# Patient Record
Sex: Female | Born: 1995 | Race: Black or African American | Hispanic: No | Marital: Single | Smoking: Never smoker
Health system: Southern US, Community
[De-identification: ages and names within clinical notes are randomized; demographics above are authoritative.]

## PROBLEM LIST (undated history)

## (undated) DIAGNOSIS — K219 Gastro-esophageal reflux disease without esophagitis: Secondary | ICD-10-CM

## (undated) DIAGNOSIS — R51 Headache: Secondary | ICD-10-CM

## (undated) DIAGNOSIS — F329 Major depressive disorder, single episode, unspecified: Secondary | ICD-10-CM

## (undated) DIAGNOSIS — K589 Irritable bowel syndrome without diarrhea: Secondary | ICD-10-CM

## (undated) DIAGNOSIS — H9319 Tinnitus, unspecified ear: Secondary | ICD-10-CM

## (undated) DIAGNOSIS — F32A Depression, unspecified: Secondary | ICD-10-CM

## (undated) HISTORY — DX: Tinnitus, unspecified ear: H93.19

## (undated) HISTORY — DX: Gastro-esophageal reflux disease without esophagitis: K21.9

## (undated) HISTORY — DX: Headache: R51

## (undated) HISTORY — DX: Irritable bowel syndrome, unspecified: K58.9

---
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
## 2017-01-01 DIAGNOSIS — F4310 Post-traumatic stress disorder, unspecified: Secondary | ICD-10-CM | POA: Diagnosis not present

## 2017-01-01 DIAGNOSIS — F321 Major depressive disorder, single episode, moderate: Secondary | ICD-10-CM | POA: Diagnosis not present

## 2017-01-01 DIAGNOSIS — F411 Generalized anxiety disorder: Secondary | ICD-10-CM | POA: Diagnosis not present

## 2017-01-03 ENCOUNTER — Encounter: Payer: Self-pay | Admitting: Pediatrics

## 2017-01-03 ENCOUNTER — Ambulatory Visit (INDEPENDENT_AMBULATORY_CARE_PROVIDER_SITE_OTHER): Payer: BLUE CROSS/BLUE SHIELD | Admitting: Pediatrics

## 2017-01-03 VITALS — BP 104/71 | HR 90 | Ht 60.24 in | Wt 118.6 lb

## 2017-01-03 DIAGNOSIS — E559 Vitamin D deficiency, unspecified: Secondary | ICD-10-CM

## 2017-01-03 DIAGNOSIS — Z975 Presence of (intrauterine) contraceptive device: Secondary | ICD-10-CM | POA: Diagnosis not present

## 2017-01-03 DIAGNOSIS — L918 Other hypertrophic disorders of the skin: Secondary | ICD-10-CM

## 2017-01-03 DIAGNOSIS — N898 Other specified noninflammatory disorders of vagina: Secondary | ICD-10-CM

## 2017-01-03 NOTE — Patient Instructions (Signed)
Go see GYN for your skin tag. They can likely remove it  We will send your wet prep and vitamin D labs to you when they come back on mychart

## 2017-01-03 NOTE — Progress Notes (Signed)
THIS RECORD MAY CONTAIN CONFIDENTIAL INFORMATION THAT SHOULD NOT BE RELEASED WITHOUT REVIEW OF THE SERVICE PROVIDER.  Adolescent Medicine Consultation Follow-Up Visit Bianca Forbes  is a 21 y.o. female referred by Kalman Jewels, MD here today for follow-up regarding vaginal discharge.    [facility] 09[facility]/16 for vaginal discharge, no infectious etiology found.   Plan at last visit included labs and monitor bleeding   - Pertinent Labs? Yes  Results for orders placed or performed in visit on 09/18/16  WET PREP BY MOLECULAR PROBE  Result Value Ref Range   Candida species NOT DETECTED NOT DETECTED   Trichomonas vaginosis NOT DETECTED NOT DETECTED   Gardnerella vaginalis NOT DETECTED NOT DETECTED  GC/Chlamydia Probe Amp  Result Value Ref Range   CT Probe RNA NOT DETECTED    GC Probe RNA NOT DETECTED     - Growth Chart Viewed? yes   History was provided by the patient.  PCP Confirmed?  yes  My Chart Activated?   yes   Chief Complaint  Patient presents with  . Follow-up    HPI:    Has some breakthrough bleeding. Last noticed bleeding  2 weeks ago and lasted for 1 week. She doesn't remember having any prior episodes of breakthrough bleeding this year.   She reports that she has her "normal" periods that occur every month. Usually last for 1 week and are associated with menstrual cramps. Bleeds heavy first 2 days (2 pads), last few days (1 pad).   She reports a small amount of white discharge. No vaginal itching. No burning during urination. Not sexual active.  She would like to have her Vit D checked because she reports that it has been low in the past. She feels like she has been more stressed she has been really sad. She was taking Cymbalta daily, but stopped 2 months ago due to dry mouth. She is now taking Wellbutrin, initially started at 100 mg, now taking 300 mg daily.    Review of Systems  Constitutional: Negative for malaise/fatigue.   Eyes: Negative for double vision.  Respiratory: Negative for shortness of breath.   Cardiovascular: Negative for chest pain and palpitations.  Gastrointestinal: Negative for abdominal pain, constipation, diarrhea, nausea and vomiting.  Genitourinary: Negative for dysuria.  Musculoskeletal: Negative for joint pain and myalgias.  Skin: Negative for rash.  Neurological: Negative for dizziness and headaches.  Endo/Heme/Allergies: Does not bruise/bleed easily.  Psychiatric/Behavioral: Positive for depression. The patient is nervous/anxious.      No LMP recorded. Patient has had an implant. No Known Allergies Outpatient Medications Prior to Visit  Medication Sig Dispense Refill  . etonogestrel (NEXPLANON) 68 MG IMPL implant 1 each by Subdermal route once.    . DULoxetine (CYMBALTA) 30 MG capsule Take 90 mg by mouth daily.     No facility-administered medications prior to visit.      Patient Active Problem List   Diagnosis Date Noted  . Benign fasciculations 08/24/2016  . Thoracic back pain 10/12/2015  . Migraine without aura and without status migrainosus, not intractable 09/21/2015  . Episodic tension-type headache, not intractable 09/21/2015  . Orbital pain 09/21/2015  . Central auditory processing disorder 05/24/2015  . Prediabetes 05/24/2015  . Menorrhagia with regular cycle 08/06/2014  . 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 Eczema 05/21/2014  . Nexplanon in place 04/08/2014  . Leukorrhea, vaginal, noninfectious 02/06/2013  . Vitamin D insufficiency 02/01/2013  . Irritable bowel syndrome 01/06/2013  . 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  Anxiety state 03/20/2012      The following portions of the patient's history were reviewed and updated as appropriate: allergies, current medications, past family history, past medical history, past social history, past surgical history and problem list.  Physical Exam:  Vitals:   01/03/17 1411  BP: 104/71  Pulse: 90  Weight: 118 lb 9.6 oz  (53.8 kg)  Height: 5' 0.24" (1.53 m)   BP 104/71   Pulse 90   Ht 5' 0.24" (1.53 m)   Wt 118 lb 9.6 oz (53.8 kg)   BMI 22.98 kg/m  Body mass index: body mass index is 22.98 kg/m. Blood pressure percentiles are 26 % systolic and 76 % diastolic based on the August 2017 AAP Clinical Practice Guideline. Blood pressure percentile targets: 90: 123/76, 95: 127/79, 95 + 12 mmHg: 139/91.   Physical Exam  Constitutional: She appears well-developed. No distress.  HENT:  Mouth/Throat: Oropharynx is clear and moist.  Neck: No thyromegaly present.  Cardiovascular: Normal rate and regular rhythm.   No murmur heard. Pulmonary/Chest: Breath sounds normal.  Abdominal: Soft. She exhibits no mass. There is no tenderness. There is no guarding.  Genitourinary: There is no rash on the right labia. There is no rash on the left labia. No bleeding in the vagina. No vaginal discharge found.  Genitourinary Comments: Skin tag on vaginal mucosa. No bleeding or tears noted.   Musculoskeletal: She exhibits no edema.  Lymphadenopathy:    She has no cervical adenopathy.  Neurological: She is alert.  Skin: Skin is warm. No rash noted.  Psychiatric: She has a normal mood and affect.  Nursing note and vitals reviewed.   Assessment/Plan:  1. Nexplanon in place - Discussed that breakthrough bleeding is a common side effect of the nexplanon implant. Patient understood and is okay with continuing with the implant.   2. Vaginal discharge - External vaginal exam with a minimal amount of clear/white discharge with no significant odor. Most likely due to her ongoing leukorrhea. However, will screen for infection. - WET PREP BY MOLECULAR PROBE - GC/Chlamydia Probe Amp - HIV antibody (with reflex)  3. Vitamin D insufficiency - Last vit D level was 17 on 06/06/16. Will repeat lab today.  - Encouraged patient to start Vit D supplementation (2,000 IU daily). - VITAMIN D 25 Hydroxy (Vit-D Deficiency, Fractures)  4. Skin  tag of vaginal mucosa - Ambulatory referral to Gynecology   Follow-up:  PRN  Medical decision-making:  >25 minutes spent face to face with patient with more than 50% of appointment spent discussing diagnosis, management, follow-up, and reviewing of vaginal discharge, pelvic exam, anxiety.

## 2017-01-04 ENCOUNTER — Other Ambulatory Visit: Payer: Self-pay | Admitting: Pediatrics

## 2017-01-04 LAB — WET PREP BY MOLECULAR PROBE
Candida species: DETECTED — AB
Gardnerella vaginalis: NOT DETECTED
MICRO NUMBER:: 81104357
SPECIMEN QUALITY:: ADEQUATE
Trichomonas vaginosis: NOT DETECTED

## 2017-01-04 LAB — VITAMIN D 25 HYDROXY (VIT D DEFICIENCY, FRACTURES): Vit D, 25-Hydroxy: 15 ng/mL — ABNORMAL LOW (ref 30–100)

## 2017-01-04 MED ORDER — FLUCONAZOLE 150 MG PO TABS: 150.0000 mg | ORAL_TABLET | Freq: Every day | ORAL | 0 refills | Status: DC

## 2017-01-04 MED ORDER — VITAMIN D (ERGOCALCIFEROL) 1.25 MG (50000 UNIT) PO CAPS: 50000.0000 [IU] | ORAL_CAPSULE | ORAL | 0 refills | Status: DC

## 2017-01-07 DIAGNOSIS — F4310 Post-traumatic stress disorder, unspecified: Secondary | ICD-10-CM | POA: Diagnosis not present

## 2017-01-08 DIAGNOSIS — F4310 Post-traumatic stress disorder, unspecified: Secondary | ICD-10-CM | POA: Diagnosis not present

## 2017-01-14 ENCOUNTER — Encounter: Payer: Self-pay | Admitting: Obstetrics & Gynecology

## 2017-01-14 DIAGNOSIS — F4310 Post-traumatic stress disorder, unspecified: Secondary | ICD-10-CM | POA: Diagnosis not present

## 2017-01-15 DIAGNOSIS — F4310 Post-traumatic stress disorder, unspecified: Secondary | ICD-10-CM | POA: Diagnosis not present

## 2017-01-29 DIAGNOSIS — F321 Major depressive disorder, single episode, moderate: Secondary | ICD-10-CM | POA: Diagnosis not present

## 2017-01-29 DIAGNOSIS — F4310 Post-traumatic stress disorder, unspecified: Secondary | ICD-10-CM | POA: Diagnosis not present

## 2017-01-29 DIAGNOSIS — Z01419 Encounter for gynecological examination (general) (routine) without abnormal findings: Secondary | ICD-10-CM | POA: Diagnosis not present

## 2017-01-29 DIAGNOSIS — F411 Generalized anxiety disorder: Secondary | ICD-10-CM | POA: Diagnosis not present

## 2017-01-29 DIAGNOSIS — N898 Other specified noninflammatory disorders of vagina: Secondary | ICD-10-CM | POA: Diagnosis not present

## 2017-02-11 ENCOUNTER — Encounter: Payer: BLUE CROSS/BLUE SHIELD | Admitting: Obstetrics & Gynecology

## 2017-02-25 DIAGNOSIS — F4310 Post-traumatic stress disorder, unspecified: Secondary | ICD-10-CM | POA: Diagnosis not present

## 2017-02-26 DIAGNOSIS — F4310 Post-traumatic stress disorder, unspecified: Secondary | ICD-10-CM | POA: Diagnosis not present

## 2017-03-04 DIAGNOSIS — F4310 Post-traumatic stress disorder, unspecified: Secondary | ICD-10-CM | POA: Diagnosis not present

## 2017-03-05 DIAGNOSIS — F4310 Post-traumatic stress disorder, unspecified: Secondary | ICD-10-CM | POA: Diagnosis not present

## 2017-03-07 DIAGNOSIS — B373 Candidiasis of vulva and vagina: Secondary | ICD-10-CM | POA: Diagnosis not present

## 2017-03-11 DIAGNOSIS — F4310 Post-traumatic stress disorder, unspecified: Secondary | ICD-10-CM | POA: Diagnosis not present

## 2017-03-12 DIAGNOSIS — F4310 Post-traumatic stress disorder, unspecified: Secondary | ICD-10-CM | POA: Diagnosis not present

## 2017-03-13 DIAGNOSIS — M5414 Radiculopathy, thoracic region: Secondary | ICD-10-CM | POA: Diagnosis not present

## 2017-03-13 DIAGNOSIS — M9902 Segmental and somatic dysfunction of thoracic region: Secondary | ICD-10-CM | POA: Diagnosis not present

## 2017-03-13 DIAGNOSIS — M9903 Segmental and somatic dysfunction of lumbar region: Secondary | ICD-10-CM | POA: Diagnosis not present

## 2017-03-13 DIAGNOSIS — M5386 Other specified dorsopathies, lumbar region: Secondary | ICD-10-CM | POA: Diagnosis not present

## 2017-03-21 DIAGNOSIS — F4310 Post-traumatic stress disorder, unspecified: Secondary | ICD-10-CM | POA: Diagnosis not present

## 2017-03-21 DIAGNOSIS — F321 Major depressive disorder, single episode, moderate: Secondary | ICD-10-CM | POA: Diagnosis not present

## 2017-03-21 DIAGNOSIS — F411 Generalized anxiety disorder: Secondary | ICD-10-CM | POA: Diagnosis not present

## 2017-03-31 DIAGNOSIS — M5386 Other specified dorsopathies, lumbar region: Secondary | ICD-10-CM | POA: Diagnosis not present

## 2017-03-31 DIAGNOSIS — M5414 Radiculopathy, thoracic region: Secondary | ICD-10-CM | POA: Diagnosis not present

## 2017-03-31 DIAGNOSIS — M9903 Segmental and somatic dysfunction of lumbar region: Secondary | ICD-10-CM | POA: Diagnosis not present

## 2017-03-31 DIAGNOSIS — M9902 Segmental and somatic dysfunction of thoracic region: Secondary | ICD-10-CM | POA: Diagnosis not present

## 2017-04-09 ENCOUNTER — Other Ambulatory Visit: Payer: Self-pay | Admitting: Pediatrics

## 2017-04-26 DIAGNOSIS — F411 Generalized anxiety disorder: Secondary | ICD-10-CM | POA: Diagnosis not present

## 2017-04-26 DIAGNOSIS — F321 Major depressive disorder, single episode, moderate: Secondary | ICD-10-CM | POA: Diagnosis not present

## 2017-04-26 DIAGNOSIS — F4310 Post-traumatic stress disorder, unspecified: Secondary | ICD-10-CM | POA: Diagnosis not present

## 2017-04-29 DIAGNOSIS — F4310 Post-traumatic stress disorder, unspecified: Secondary | ICD-10-CM | POA: Diagnosis not present

## 2017-04-30 DIAGNOSIS — B379 Candidiasis, unspecified: Secondary | ICD-10-CM | POA: Diagnosis not present

## 2017-04-30 DIAGNOSIS — R21 Rash and other nonspecific skin eruption: Secondary | ICD-10-CM | POA: Diagnosis not present

## 2017-05-12 DIAGNOSIS — F4310 Post-traumatic stress disorder, unspecified: Secondary | ICD-10-CM | POA: Diagnosis not present

## 2017-05-14 DIAGNOSIS — B379 Candidiasis, unspecified: Secondary | ICD-10-CM | POA: Diagnosis not present

## 2017-05-14 DIAGNOSIS — B373 Candidiasis of vulva and vagina: Secondary | ICD-10-CM | POA: Diagnosis not present

## 2017-07-05 DIAGNOSIS — F411 Generalized anxiety disorder: Secondary | ICD-10-CM | POA: Diagnosis not present

## 2017-07-05 DIAGNOSIS — F321 Major depressive disorder, single episode, moderate: Secondary | ICD-10-CM | POA: Diagnosis not present

## 2017-07-05 DIAGNOSIS — F4310 Post-traumatic stress disorder, unspecified: Secondary | ICD-10-CM | POA: Diagnosis not present

## 2017-09-02 DIAGNOSIS — F411 Generalized anxiety disorder: Secondary | ICD-10-CM | POA: Diagnosis not present

## 2017-09-02 DIAGNOSIS — F321 Major depressive disorder, single episode, moderate: Secondary | ICD-10-CM | POA: Diagnosis not present

## 2017-09-02 DIAGNOSIS — F4310 Post-traumatic stress disorder, unspecified: Secondary | ICD-10-CM | POA: Diagnosis not present

## 2017-10-31 DIAGNOSIS — F321 Major depressive disorder, single episode, moderate: Secondary | ICD-10-CM | POA: Diagnosis not present

## 2017-10-31 DIAGNOSIS — F411 Generalized anxiety disorder: Secondary | ICD-10-CM | POA: Diagnosis not present

## 2017-10-31 DIAGNOSIS — F4310 Post-traumatic stress disorder, unspecified: Secondary | ICD-10-CM | POA: Diagnosis not present

## 2018-05-12 DIAGNOSIS — B373 Candidiasis of vulva and vagina: Secondary | ICD-10-CM | POA: Diagnosis not present

## 2018-05-13 DIAGNOSIS — E559 Vitamin D deficiency, unspecified: Secondary | ICD-10-CM | POA: Diagnosis not present

## 2018-06-06 DIAGNOSIS — Z3009 Encounter for other general counseling and advice on contraception: Secondary | ICD-10-CM | POA: Diagnosis not present

## 2018-06-06 DIAGNOSIS — N760 Acute vaginitis: Secondary | ICD-10-CM | POA: Diagnosis not present

## 2018-06-06 DIAGNOSIS — Z3046 Encounter for surveillance of implantable subdermal contraceptive: Secondary | ICD-10-CM | POA: Diagnosis not present

## 2018-08-04 DIAGNOSIS — F332 Major depressive disorder, recurrent severe without psychotic features: Secondary | ICD-10-CM | POA: Diagnosis not present

## 2018-08-04 DIAGNOSIS — F411 Generalized anxiety disorder: Secondary | ICD-10-CM | POA: Diagnosis not present

## 2018-08-04 DIAGNOSIS — F4310 Post-traumatic stress disorder, unspecified: Secondary | ICD-10-CM | POA: Diagnosis not present

## 2018-09-24 DIAGNOSIS — F4310 Post-traumatic stress disorder, unspecified: Secondary | ICD-10-CM | POA: Diagnosis not present

## 2018-09-24 DIAGNOSIS — F332 Major depressive disorder, recurrent severe without psychotic features: Secondary | ICD-10-CM | POA: Diagnosis not present

## 2018-09-24 DIAGNOSIS — F411 Generalized anxiety disorder: Secondary | ICD-10-CM | POA: Diagnosis not present

## 2018-10-29 ENCOUNTER — Telehealth (INDEPENDENT_AMBULATORY_CARE_PROVIDER_SITE_OTHER): Payer: Self-pay | Admitting: Pediatrics

## 2018-10-29 NOTE — Telephone Encounter (Signed)
I called and spoke with Khira. She said that she has had muscle spasms in various places all over her body for years but that recently she has been having "intense" spasms in her temples that prevent her from sleeping at night. She has tried ice packs with some relief. She worries that these spasms are indicative of some ominous neurological problem and wants advice on how to get relief from the spasms when they occur. In talking with Karista, she also reported that she is seeing a psychiatrist for anxiety, depression, PTSD and OCD. She said that she takes a daily medication for these problems and that the psychiatrist just gave her a benzodiazepine for the muscle spasms in her temples but she has not taken it yet. I talked with Takeria and told her that the muscle tightness that she reports is common with anxiety and with tension headaches. I told her that there was no specific treatment but that lifestyle measures to manage anxiety and mood worked best. I recommended adequate hydration, relaxation exercises and warm or cold packs to the painful area. I also told her that there were numerous apps that helped with relaxation. She asked if the benzodiazepine prescribed by her psychiatrist would be recommended and/or beneficial and I told her that while these medications can be helpful temporarily that they were not a specific treatment or solution, that they could be very habit forming and should only be used very sparingly. Merridith asked if Botox would be a recommended treatment and I told her not with the information that I have at this time. Finally, she said that she had made a follow up appointment in this office with me on August 10th. I will consult with Dr Gaynell Face as needed when she is in the office that day. TG

## 2018-10-29 NOTE — Telephone Encounter (Signed)
°  Who's calling (name and relationship to patient) : Jessamine (Self) Best contact number: [phone] Provider they see: Dr. Gaynell Face  Reason for call: Pt stated she is having intense muscle spasms and would like to speak with the on call provider.

## 2018-10-30 NOTE — Telephone Encounter (Signed)
I reviewed the note and agree with the assessment and recommendations.  This may be some form of migraine or tension type headache.  There is no evidence based on her concerns that there is some underlying progressive focal or generalized neurologic disorder.

## 2018-11-10 ENCOUNTER — Other Ambulatory Visit: Payer: Self-pay

## 2018-11-10 ENCOUNTER — Encounter (INDEPENDENT_AMBULATORY_CARE_PROVIDER_SITE_OTHER): Payer: Self-pay | Admitting: Family

## 2018-11-10 ENCOUNTER — Ambulatory Visit (INDEPENDENT_AMBULATORY_CARE_PROVIDER_SITE_OTHER): Payer: Medicaid Other | Admitting: Family

## 2018-11-10 VITALS — BP 106/74 | HR 100 | Ht 60.0 in | Wt 110.4 lb

## 2018-11-10 DIAGNOSIS — R253 Fasciculation: Secondary | ICD-10-CM

## 2018-11-10 MED ORDER — CYCLOBENZAPRINE HCL 5 MG PO TABS: ORAL_TABLET | ORAL | 0 refills | Status: DC

## 2018-11-10 NOTE — Progress Notes (Signed)
Bianca Forbes   MRN:  [id number]  1995/12/18   Provider: Rockwell Germany NP-C Location of Care: [facility]  Visit type: Routine Follow-Up  Last visit: 08/24/2016 with Dr. Gaynell Face  Referral source: Lenore Cordia, MD History from: [facility] chart and patient  Brief history:  History of atypical migraines, migraine without aura, status migrainosus, tension headaches, tinnitus and subjective muscle fasciculations, as well as depression, anxiety, panic, PTSD and OCD. She is seeing a psychiatrist for the behavioral health disorders.   Today's concerns: Bianca Forbes is being seen today because she called the office on October 29, 2018 to report "muscle spasms" in the extremities as well as in her temples that can keep her from sleep or can awaken her from sleep. She was very fearful that these muscle spasms represent an ominous neurologic problems and remains very fearful of this. She said that her psychiatrist gave her a Benzodiazepine for it but she doesn't know the name of the drug. She said that the strength is 0.86m and it did not help with her complaints of spasms. She notified the psychiatrist who reportedly increased the dose. [name] tells me that the medication was selected for her based on a genetic test to determine the best treatment for her. The medication list indicates that this drug would be Clonazepam but [name] is unsure of that fact. She also reports taking Prozac and Wellbutrin. She reports trying relaxation exercises but said that doing the exercises made her feel more anxious.   For the muscle spasms she says that they can be visible externally, but typically she can feel them occurring rather than being visible. She also complains of sudden jerks in her body that startle her when they occur. She has not lost consciousness with any of these events. Bianca Forbes denies severe headaches but says that she has occasional headaches that require rest to resolve. In addition to  muscle spasms and jerks, she also reports tremors in her hands that varies in severity. She says that the tremor worsens when she is emotionally upset or anxious but that it does not interfere with her daily functioning.    Bianca Forbes reports that she had a closed head injury about 5 years ago in which she fainted and hit her head on a granite countertop. She said that she was not evaluated for this injury and she worries that the muscle spasms are occurring as a latent effect of the head injury. Bianca Forbes also tells me that she had childhood trauma and knows that she has a score related to adverse outcomes of childhood trauma. She said that she has been told that this scoring relates to brain function and also worries that this is the cause of the muscle spasms. She reports having a normal brain MRI several years ago but is fearful that "something is interfering with brain function" since then. She wants "full and complete work up" to determine the cause of the involuntary movements because she has a BDietitianin psychology and she knows that spasms and other involuntary movements indicate a neurologic disorder. Bianca Forbes also expressed frustration that symptoms or complaints from black women are taken less seriously than those of white counterparts, and says that she is advocating for adequate evaluation and treatment for herself.   Bianca Forbes reports being otherwise generally healthy since her last visit with Dr HGaynell Facein 2018. She [facility] [employer] to take this year off, then return to graduate studies next year to pursue a PhD in  psychology. She has no other health concerns today other than previously mentioned.   Review of systems: Please see HPI for neurologic and other pertinent review of systems. Otherwise all other systems were reviewed and were negative.  Problem List: Patient Active Problem List   Diagnosis Date Noted   Benign fasciculations 08/24/2016   Thoracic back pain  10/12/2015   Migraine without aura and without status migrainosus, not intractable 09/21/2015   Episodic tension-type headache, not intractable 09/21/2015   Orbital pain 09/21/2015   Central auditory processing disorder 05/24/2015   Prediabetes 05/24/2015   Menorrhagia with regular cycle 08/06/2014   Tinnitus, subjective 06/28/2014   Eczema 05/21/2014   Nexplanon in place 04/08/2014   Leukorrhea, vaginal, noninfectious 02/06/2013   Vitamin D insufficiency 02/01/2013   Irritable bowel syndrome 01/06/2013   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   Family history: family history includes Alzheimer's disease in her maternal grandmother; Diabetes in her mother; Irritable bowel syndrome in her father; Kidney disease in her maternal grandfather; Lactose intolerance in her father; Other in her paternal grandfather; Seizures in her cousin and maternal grandmother.   Social history: Social History   Socioeconomic History   Marital status: Single    Spouse name: Not on file   Number of children: Not on file   Years of education: Not on file   Highest education level: Not on file  Occupational History   Not on file  Social Needs   Financial resource strain: Not on file   Food insecurity    Worry: Not on file    Inability: Not on file   Transportation needs    Medical: Not on file    Non-medical: Not on file  Tobacco Use   Smoking status: Never Smoker   Smokeless tobacco: Never Used  Substance and Sexual Activity   Alcohol use: No    Alcohol/week: 0.0 standard drinks   Drug use: No   Sexual  activity: Never  Lifestyle   Physical activity    Days per week: Not on file    Minutes per session: Not on file   Stress: Not on file  Relationships   Social connections    Talks on phone: Not on file    Gets together: Not on file    Attends religious service: Not on file    Active member of club or organization: Not on file    Attends meetings of clubs or organizations: Not on file    Relationship status: Not on file   Intimate partner violence    Fear of current or ex partner: Not on file    Emotionally abused: Not on file    Physically abused: Not on file    Forced sexual activity: Not on file  Other Topics Concern   Not on file  Social History Narrative   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    She lives with both parents.      Past/failed  meds:   Allergies: No Known Allergies    Immunizations: Immunization History  Administered Date(s) Administered   DTaP 11/15/1997, 01/17/1998, 03/30/1998, 04/07/1999, 09/25/2002   HPV 9-valent 05/21/2014   HPV Quadrivalent 09/04/2012, 01/06/2013   Hepatitis A 09/04/2012   Hepatitis A, Ped/Adol-2 Dose 05/21/2014   Hepatitis B December [birth year], 1997, 03/30/1998, 07/27/1998   HiB (PRP-OMP) 11/15/1997, 01/17/1998, 03/30/1998, 04/17/1999   IPV 11/15/1997, 01/17/1998, 04/07/1999, 09/25/2002   Influenza,inj,Quad PF,6+ Mos 05/24/2015   Influenza-Unspecified 01/06/2013   MMR 04/07/1999, 09/25/2002   Meningococcal Conjugate 09/04/2012, 05/21/2014   Td 08/06/2008   Tdap 08/06/2008   Varicella 04/07/1999, 08/06/2008      Diagnostics/Screenings: 01/01/2012 - MRI Brain w/wo contrast [facility]) - normal examination   Physical Exam: BP 106/74    Pulse 100    Ht 5' (1.524 m)    Wt 110 lb 6.4 oz (50.1 kg)    BMI 21.56 kg/m   General: Well developed, well nourished young woman, seated on exam table, in no  evident distress, black hair, brown eyes, right handed Head: Head normocephalic and atraumatic.  Oropharynx benign. Neck: Supple with no carotid bruits Cardiovascular: Regular rate and rhythm, no murmurs Respiratory: Breath sounds clear to auscultation Musculoskeletal: No obvious deformities or scoliosis Skin: No rashes or neurocutaneous lesions  Neurologic Exam Mental Status: Awake and fully alert.  Oriented to place and time.  Recent and remote memory intact.  Attention span, concentration, and fund of knowledge appropriate.  Mood and affect anxious, and she seemed to be near tears at times when relating her concerns. Speech was rapid and she repeated her thoughts frequently.  Cranial Nerves: Fundoscopic exam reveals sharp disc margins.  Pupils equal, briskly reactive to light.  Extraocular movements full without nystagmus.  Visual fields full to confrontation.  Hearing intact and symmetric to finger rub.  Facial sensation intact.  Face tongue, palate move normally and symmetrically.  Neck flexion and extension normal. Motor: Normal bulk and tone. Normal strength in all tested extremity muscles. Very mild outstretched hand tremor. Sensory: Intact to touch and temperature in all extremities.  Coordination: Rapid alternating movements normal in all extremities.  Finger-to-nose and heel-to shin performed accurately bilaterally.  Romberg negative. Gait and Station: Arises from chair without difficulty.  Stance is normal. Gait demonstrates normal stride length and balance.   Able to heel, toe and tandem walk without difficulty. Reflexes: 1+ and symmetric. Toes downgoing.  Impression: 1. Reports of muscle spasms 2. Reports of sudden muscle jerks 3. Essential tremor 4. Migraine without aura 5. Tension headaches 6. Mood disorder with depression, anxiety, panic and reports of PTSD and OCD.    Recommendations for plan of care: The patient's previous [facility] records were reviewed. Tresa has neither  had nor required imaging or lab studies since the last visit. She is a 23 year old young woman with complains of muscle spasms, sudden body jerks, essential tremor, migraine and tension headaches as well as mood disorder treated by a psychiatrist. I talked with Mariama about her concerns and attempted to reassure her that she has a normal examination and that indicates that her brain is healthy. I recommended performing an EEG because of her complaints of sudden jerks, but told her that this likely represents myoclonus. I told her that the EEG would also indicate if there was impedence that is sometimes seen with mass or lesion. We talked about involuntary movements such as essential tremor and muscle spasms occurring in healthy individuals, and that the spasm that she has noted in  her temples can be from headache. We also talked about the role of mood and anxiety in her symptoms but she remained adamant that anxiety was a separate problem and that her symptoms were indicative of a neurologic disorder. I asked her to get the EEG done, and to return for follow up in 1-2 weeks so that we can review the results together. I also recommended a trial of low dose Cyclobenzaprine for muscle spasms that prevent her from sleeping at night. I strongly cautioned her not to take it during the day as it as a side effect of sleepiness. I also talked with her about trying different relaxation exercises as there are some that may work better than others for her. Annelyse agreed with the plans made today.   The medication list was reviewed and reconciled. I reviewed changes that were made in the prescribed medications today. A complete medication list was provided to the patient.  Allergies as of 11/10/2018   No Known Allergies     Medication List       Accurate as of November 10, 2018 11:59 PM. If you have any questions, ask your nurse or doctor.        buPROPion 300 MG 24 hr tablet Commonly known as: WELLBUTRIN XL Take  300 mg by mouth daily.   clonazePAM 0.5 MG tablet Commonly known as: KLONOPIN TAKE 1 2 TO 1 (ONE HALF TO ONE) TABLET BY MOUTH TWICE DAILY AS NEEDED FOR ANXIETY AND PANIC ATTACK   cyclobenzaprine 5 MG tablet Commonly known as: FLEXERIL Take 1/2 to 1 tablet at bedtime when muscle spasms keep you awake Started by: Rockwell Germany, NP   desvenlafaxine 50 MG 24 hr tablet Commonly known as: PRISTIQ TAKE 1 TABLET BY MOUTH ONCE DAILY FOR ANXIETY   fluconazole 150 MG tablet Commonly known as: Diflucan Take 1 tablet (150 mg total) by mouth daily.   mirtazapine 7.5 MG tablet Commonly known as: REMERON Take 7.5 mg by mouth at bedtime.   Nexplanon 68 MG Impl implant Generic drug: etonogestrel 1 each by Subdermal route once.   Vitamin D (Ergocalciferol) 1.25 MG (50000 UT) Caps capsule Commonly known as: DRISDOL TAKE 1 CAPSULE (50,000 UNITS TOTAL) BY MOUTH EVERY 7 (SEVEN) DAYS.       I consulted with Dr Gaynell Face regarding this patient.  Total time spent with the patient was 35 minutes, of which 50% or more was spent in counseling and coordination of care.  Rockwell Germany NP-C [facility] Ph. [phone] Fax [phone]

## 2018-11-10 NOTE — Patient Instructions (Addendum)
Thank you for coming in today.   Instructions for you until your next appointment are as follows: 1. We will perform an EEG in this office to evaluate brain wave activity. I will meet with you in a couple of weeks to review the results.  2. For muscle spasms at night that keep you from sleeping, try Cyclobenzaprine - 1/2 to 1 tablet at bedtime 3. Work on relaxation exercises as we discussed today.  4. Please sign up for MyChart if you have not done so 5. Please plan to return for follow up in about 2 weeks or sooner if needed.

## 2018-11-11 ENCOUNTER — Encounter (INDEPENDENT_AMBULATORY_CARE_PROVIDER_SITE_OTHER): Payer: Self-pay | Admitting: Family

## 2018-11-25 ENCOUNTER — Ambulatory Visit (INDEPENDENT_AMBULATORY_CARE_PROVIDER_SITE_OTHER): Payer: Medicaid Other

## 2018-11-25 ENCOUNTER — Other Ambulatory Visit: Payer: Self-pay

## 2018-11-25 ENCOUNTER — Ambulatory Visit (INDEPENDENT_AMBULATORY_CARE_PROVIDER_SITE_OTHER): Payer: Medicaid Other | Admitting: Pediatrics

## 2018-11-25 DIAGNOSIS — R253 Fasciculation: Secondary | ICD-10-CM

## 2018-11-25 DIAGNOSIS — G2569 Other tics of organic origin: Secondary | ICD-10-CM | POA: Diagnosis not present

## 2018-11-25 NOTE — Progress Notes (Signed)
EEG complete - results pending 

## 2018-11-26 ENCOUNTER — Ambulatory Visit (INDEPENDENT_AMBULATORY_CARE_PROVIDER_SITE_OTHER): Payer: Medicaid Other | Admitting: Pediatrics

## 2018-11-26 ENCOUNTER — Encounter (INDEPENDENT_AMBULATORY_CARE_PROVIDER_SITE_OTHER): Payer: Self-pay | Admitting: Pediatrics

## 2018-11-26 ENCOUNTER — Other Ambulatory Visit: Payer: Self-pay

## 2018-11-26 VITALS — BP 110/80 | HR 84 | Ht 60.75 in | Wt 113.6 lb

## 2018-11-26 DIAGNOSIS — R253 Fasciculation: Secondary | ICD-10-CM | POA: Diagnosis not present

## 2018-11-26 DIAGNOSIS — G4489 Other headache syndrome: Secondary | ICD-10-CM | POA: Diagnosis not present

## 2018-11-26 DIAGNOSIS — G250 Essential tremor: Secondary | ICD-10-CM | POA: Diagnosis not present

## 2018-11-26 DIAGNOSIS — M62838 Other muscle spasm: Secondary | ICD-10-CM | POA: Diagnosis not present

## 2018-11-26 NOTE — Progress Notes (Signed)
Patient: Bianca Forbes MRN: [id number] Sex: female DOB: 05-16-1995  Provider: Ellison Carwin, MD Location of Care: [facility]  Note type: Routine return visit  History of Present Illness: Referral Source: Delorse Lek, MD History from: patient and [facility] chart Chief Complaint: muscle spasms/shaking hands  Bianca Forbes is a 23 y.o. female who was evaluated on January 26, 2019, for the first time since January 10, 2019.  She has a history of atypical migraines, migraine without aura, status migrainosus, episodic tension-type headaches, tinnitus, and subjective muscle fasciculations.  She also has significant problems with depression, anxiety, panic disorder, PTSD, and OCD.  She is followed for this by a psychiatrist.  She was seen and a detailed evaluation performed on November 10, 2018, by Elveria Rising.  She says that she has muscle spasms that happen when she is awake or asleep, but that when she experiences them, she cannot fall asleep.  She says that she feels as if the muscles are tight and the only way that she can get to sleep is if she takes a tennis ball and pushes it against her temple.  Sometimes, that helps counteract the spasms and allows her to fall asleep.  She says that she sleeps about 6 to 7 hours at nighttime and that she would sleep longer if she was not awakened from sleep by these spasms.  She made some videos that clearly show fasciculations of her eyelids.  I told her that those were benign fasciculations because they are unassociated with weakness or spasticity.  I asked her if that was what she meant by the spasms and she said that the spasms were hard to pick up and were not always visible to others.  She has a significant essential tremor which has been present for well over 6 months.  When she experiences the muscle spasms, they can be as brief as 20 minutes or as long as hours.  She remembers having difficulty when she was in 5th grade, but  she says that the spasms have become worse, more frequent, last longer, and hurt more.  We performed an EEG today to make certain that the spasms were not some form of subclinical seizure and the EEG was entirely normal.  I am willing, because it has been years since she had an MRI scan, to order an MRI scan with the understanding that we are trying to investigate her facial spasms to be certain that there is no abnormality within the diencephalon or brainstem.  I told her also that we need to make certain that she did not have any infectious or inflammatory problem or an underlying problem with her body salts, liver, or kidneys.  The patient has graduated from college.  She is living at home with her parents.  She has a graduate degree in psychology and is unable to obtain a job.  It is her belief that she had not been listened to when she saw Ms. Goodpasture, but the note clearly documents her complaints and concerns and the examination was very thorough.  Review of Systems: A complete review of systems was remarkable for patient reports to the office with some concerns from her last visit. She states that she is also here for EEG results. She states that she has shaking hands and muscle spasms at night, all other systems reviewed and negative.  Past Medical History Diagnosis Date  . Depression   . GERD (gastroesophageal reflux disease)   . Headache(784.0)   . Irritable  bowel syndrome   . Tinnitus    Saw ENT, assoc with emesis when occured   Hospitalizations: No., Head Injury: No., Nervous System Infections: No., Immunizations up to date: Yes.    Behavior History Depression and anxiety  Surgical History History reviewed. No pertinent surgical history.  Family History family history includes Alzheimer's disease in her maternal grandmother; Diabetes in her mother; Irritable bowel syndrome in her father; Kidney disease in her maternal grandfather; Lactose intolerance in her father; Other in  her paternal grandfather; Seizures in her cousin and maternal grandmother. Family history is negative for migraines, seizures, intellectual disabilities, blindness, deafness, birth defects, chromosomal disorder, or autism.  Social History Socioeconomic History  . Marital status: Single  . Years of education:  15  . Highest education level:  College graduate  Occupational History  . Not employed  Social Needs  . Financial resource strain: Not on file  . Food insecurity    Worry: Not on file    Inability: Not on file  . Transportation needs    Medical: Not on file    Non-medical: Not on file  Tobacco Use  . Smoking status: Never Smoker  . Smokeless tobacco: Never Used  Substance and Sexual Activity  . Alcohol use: No    Alcohol/week: 0.0 standard drinks  . Drug use: No  . 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     She lives with both parents.   No Known Allergies  Physical Exam BP 110/80   Pulse 84   Ht 5' 0.75" (1.543 m)   Wt 113 lb 9.6 oz (51.5 kg)   BMI 21.64 kg/m   General: alert, well developed, well nourished, in no acute distress, black hair, brown eyes, right handed Head: normocephalic, no dysmorphic features Ears, Nose and Throat: Otoscopic: tympanic membranes normal; pharynx: oropharynx is pink without exudates or tonsillar hypertrophy Neck: supple, full range of motion, no cranial or cervical bruits Respiratory: auscultation clear Cardiovascular: no murmurs, pulses are normal Musculoskeletal: no skeletal deformities or apparent scoliosis Skin: no rashes or neurocutaneous lesions  Neurologic Exam  Mental Status: alert; oriented to person, place and year; knowledge is normal for age; language is normal Cranial Nerves: visual fields are full to double simultaneous stimuli; extraocular movements  are full and conjugate; pupils are round reactive to light; funduscopic examination shows sharp disc margins with normal vessels; symmetric facial strength; midline tongue and uvula; air conduction is greater than bone conduction bilaterally; I did not see any fasciculations or other adventitious movements Motor: Normal strength, tone and mass; good fine motor movements; no pronator drift; low amplitude tremor over arms and hands when extended Sensory: intact responses to cold, vibration, proprioception and stereognosis Coordination: good finger-to-nose, rapid repetitive alternating movements and finger apposition Gait and Station: normal gait and station: patient is able to walk on heels, toes and tandem without difficulty; balance is adequate; Romberg exam is negative; Gower response is negative Reflexes: symmetric and diminished bilaterally; no clonus; bilateral flexor plantar responses  Assessment 1. Other headache syndrome, G44.89. 2. Night muscle spasms, M62.838. 3. Essential tremor, G25.0. 4. 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  Discussion I do not know what is causing the patient's symptoms.  I think that it is not unreasonable to perform an MRI scan to make certain that there is  no underlying demyelinating process.  I think it is highly unlikely that we will find a structural abnormality because her examination was normal.  I am not certain why she is experiencing benign fasciculations.  There is no family history of essential tremor, but she clearly has that, although I would not use beta-blocker drugs at this time to treat it.    Plan As mentioned, I am willing to perform an MRI scan to evaluate her atypical spasms/headache.  If it is approved, we will move forward.  If it is not, I will inform her that I have nothing else to offer at this time.  I do not think that we should be treating her essential tremor and there is no treatment for the fasciculations.  I do not think that she is having  migraines and I am certain she is not having seizures.  I urged her if that is the case to seek a second opinion with adult neurologist and I will provide contact information for the 2 practices in town.   Medication List   Accurate as of November 26, 2018 11:59 PM. If you have any questions, ask your nurse or doctor.      TAKE these medications   buPROPion 300 MG 24 hr tablet Commonly known as: WELLBUTRIN XL Take 300 mg by mouth daily.   clonazePAM 0.5 MG tablet Commonly known as: KLONOPIN TAKE 1 2 TO 1 (ONE HALF TO ONE) TABLET BY MOUTH TWICE DAILY AS NEEDED FOR ANXIETY AND PANIC ATTACK   desvenlafaxine 50 MG 24 hr tablet Commonly known as: PRISTIQ TAKE 1 TABLET BY MOUTH ONCE DAILY FOR ANXIETY    The medication list was reviewed and reconciled. All changes or newly prescribed medications were explained.  A complete medication list was provided to the patient/caregiver.  Deetta PerlaWilliam H  MD

## 2018-11-26 NOTE — Progress Notes (Signed)
Patient: Bianca Forbes MRN: [id number] Sex: female DOB: 08/25/1995  Clinical History: Breannah is a 23 y.o. with atypical migraines, migraine without aura, status migrainosus, episodic tension type headaches, tinnitus, subjective muscle fasciculations.  She also has history of depression, anxiety, panic, PTSD, and OCD.  She sees a psychiatrist for the latter.  She has complaints of muscle spasms in her extremities and in her temples that interfere with sleep and awaken her from sleep.  0.5 mg of clonazepam did not eliminate the symptoms..  She feels the spasms more than they are evident to others.  She also has sudden jerks of her body that started her when they occur.  Her headaches are less prominent.  This study is performed to look for the presence of seizure activity and correlate any spasms with her EEG.  Medications: clonazepam (Klonopin)  Procedure: The tracing is carried out on a 32-channel digital Natus recorder, reformatted into 16-channel montages with 1 devoted to EKG.  The patient was awake and drowsy during the recording.  The international 10/20 system lead placement used.  Recording time 31.1 minutes.   Description of Findings: Dominant frequency is 30-60 V, 11 hz, alpha range activity that is well modulated and well regulated, posteriorly and symmetrically distributed, and attenuates with eye opening.    Background activity consists of less than 15 V beta range activity that is broadly distributed.  The patient comes drowsy with rhythmic theta and upper delta range activity but does not drifted to natural sleep.  There was no interictal epileptiform activity in the form of spikes or sharp waves..  There is no change in background throughout the record however the patient did not have the characteristic symptoms.  Activating procedures included intermittent photic stimulation, and hyperventilation.  Intermittent photic stimulation induced a sustained symmetric driving  response at 1-21 hz.  Hyperventilation caused no significant change in background.  EKG showed a regular sinus rhythm with a ventricular response of 96 beats per minute.  Impression: This is a normal record with the patient awake and drowsy.  A normal EEG does not rule out the presence of seizures.  Wyline Copas, MD

## 2018-11-26 NOTE — Patient Instructions (Signed)
Thank you for coming today we are going to try to further explore your symptoms.  Your EEG was normal.  We are ordering an MRI scan and laboratory to see if we can sort out your symptoms.  I will get back with you when I find out if the MRI has been approved.  Your fasciculations are benign and though they are annoying they were not serious the same thing is true for the tremor.  The spasms you have in your temples are keeping you from sleeping.  Fortunately I did not find any other abnormalities and your EEG is reassuring.

## 2018-12-04 ENCOUNTER — Other Ambulatory Visit: Payer: Self-pay | Admitting: Pediatrics

## 2018-12-05 LAB — CBC WITH DIFFERENTIAL/PLATELET
Absolute Monocytes: 258 cells/uL (ref 200–950)
Basophils Absolute: 30 cells/uL (ref 0–200)
Basophils Relative: 0.8 %
Eosinophils Absolute: 110 cells/uL (ref 15–500)
Eosinophils Relative: 2.9 %
HCT: 38 % (ref 35.0–45.0)
Hemoglobin: 12.2 g/dL (ref 11.7–15.5)
Lymphs Abs: 2018 cells/uL (ref 850–3900)
MCH: 28.3 pg (ref 27.0–33.0)
MCHC: 32.1 g/dL (ref 32.0–36.0)
MCV: 88.2 fL (ref 80.0–100.0)
MPV: 9.5 fL (ref 7.5–12.5)
Monocytes Relative: 6.8 %
Neutro Abs: 1383 cells/uL — ABNORMAL LOW (ref 1500–7800)
Neutrophils Relative %: 36.4 %
Platelets: 259 10*3/uL (ref 140–400)
RBC: 4.31 10*6/uL (ref 3.80–5.10)
RDW: 13.5 % (ref 11.0–15.0)
Total Lymphocyte: 53.1 %
WBC: 3.8 10*3/uL (ref 3.8–10.8)

## 2018-12-05 LAB — COMPLETE METABOLIC PANEL WITH GFR
AG Ratio: 1.6 (calc) (ref 1.0–2.5)
ALT: 6 U/L (ref 6–29)
AST: 13 U/L (ref 10–30)
Albumin: 4.4 g/dL (ref 3.6–5.1)
Alkaline phosphatase (APISO): 36 U/L (ref 31–125)
BUN/Creatinine Ratio: 7 (calc) (ref 6–22)
BUN: 6 mg/dL — ABNORMAL LOW (ref 7–25)
CO2: 25 mmol/L (ref 20–32)
Calcium: 9.4 mg/dL (ref 8.6–10.2)
Chloride: 104 mmol/L (ref 98–110)
Creat: 0.88 mg/dL (ref 0.50–1.10)
GFR, Est African American: 109 mL/min/{1.73_m2} (ref >=60)
GFR, Est Non African American: 94 mL/min/{1.73_m2} (ref >=60)
Globulin: 2.8 g/dL (calc) (ref 1.9–3.7)
Glucose, Bld: 88 mg/dL (ref 65–99)
Potassium: 4.4 mmol/L (ref 3.5–5.3)
Sodium: 138 mmol/L (ref 135–146)
Total Bilirubin: 0.3 mg/dL (ref 0.2–1.2)
Total Protein: 7.2 g/dL (ref 6.1–8.1)

## 2018-12-05 LAB — THYROID PANEL WITH TSH
Free Thyroxine Index: 2.1 (ref 1.4–3.8)
T3 Uptake: 29 % (ref 22–35)
T4, Total: 7.1 ug/dL (ref 5.1–11.9)
TSH: 1.62 mIU/L

## 2018-12-05 LAB — SEDIMENTATION RATE: Sed Rate: 6 mm/h (ref 0–20)

## 2018-12-29 ENCOUNTER — Ambulatory Visit
Admission: RE | Admit: 2018-12-29 | Discharge: 2018-12-29 | Disposition: A | Discharge status: Home / Self Care | Payer: Medicaid Other | Source: Ambulatory Visit | Attending: Pediatrics | Admitting: Pediatrics

## 2018-12-29 ENCOUNTER — Other Ambulatory Visit: Payer: Self-pay

## 2018-12-29 DIAGNOSIS — G4489 Other headache syndrome: Secondary | ICD-10-CM

## 2018-12-30 ENCOUNTER — Telehealth (INDEPENDENT_AMBULATORY_CARE_PROVIDER_SITE_OTHER): Payer: Self-pay | Admitting: Pediatrics

## 2018-12-30 NOTE — Telephone Encounter (Signed)
I reviewed the MRI scan of the brain without contrast and it is normal.  I called the patient with the results.

## 2019-01-13 ENCOUNTER — Telehealth (INDEPENDENT_AMBULATORY_CARE_PROVIDER_SITE_OTHER): Payer: Self-pay | Admitting: Pediatrics

## 2019-01-13 NOTE — Telephone Encounter (Signed)
Who's calling (name and relationship to patient) : Litzi Binning (self)  Best contact number: [phone]  Provider they see: Dr. Gaynell Face  Reason for call:  Scarlettrose called in wanting to speak with Dr. Gaynell Face regarding her results, and what options she has. Does she need to be transferred to an adult neurologist or can Dr. Gaynell Face continue to see her?  Call ID:      PRESCRIPTION REFILL ONLY  Name of prescription:  Pharmacy:

## 2019-01-13 NOTE — Telephone Encounter (Signed)
I spoke with Bianca Forbes for about 5 minutes.  I gave her the names of the 2 neurology groups in town and told her there was no one specifically who would treat her condition.  She asked about Botox and I mentioned that there are some doctors that administer Botox but I did not know if there was truly an indication for that.  I told her that I would be happy to make a referral to whichever group she chose.

## 2019-01-14 DIAGNOSIS — H6993 Unspecified Eustachian tube disorder, bilateral: Secondary | ICD-10-CM | POA: Insufficient documentation

## 2019-01-14 DIAGNOSIS — H6983 Other specified disorders of Eustachian tube, bilateral: Secondary | ICD-10-CM | POA: Insufficient documentation

## 2019-01-16 ENCOUNTER — Telehealth (INDEPENDENT_AMBULATORY_CARE_PROVIDER_SITE_OTHER): Payer: Self-pay | Admitting: Pediatrics

## 2019-01-16 DIAGNOSIS — G43009 Migraine without aura, not intractable, without status migrainosus: Secondary | ICD-10-CM

## 2019-01-16 DIAGNOSIS — G250 Essential tremor: Secondary | ICD-10-CM

## 2019-01-16 DIAGNOSIS — R253 Fasciculation: Secondary | ICD-10-CM

## 2019-01-16 DIAGNOSIS — G44219 Episodic tension-type headache, not intractable: Secondary | ICD-10-CM

## 2019-01-16 DIAGNOSIS — M62838 Other muscle spasm: Secondary | ICD-10-CM

## 2019-01-16 NOTE — Telephone Encounter (Signed)
Referral has been sent to [facility]  

## 2019-01-16 NOTE — Telephone Encounter (Signed)
Patient chose Bianca Forbes.Please place the referral so that I can work it

## 2019-01-16 NOTE — Telephone Encounter (Signed)
Referral was made and is available for you.

## 2019-01-16 NOTE — Addendum Note (Signed)
Addended by: Jodi Geralds on: 01/16/2019 03:22 PM   Modules accepted: Orders

## 2019-01-16 NOTE — Telephone Encounter (Signed)
Who's calling (name and relationship to patient) : Freedom Lopezperez   Best contact number: [phone]  Provider they see: Dr. Gaynell Face  Reason for call:  Ermagene called in to notify Dr. Gaynell Face of the adult neuro she was wanting to now be referred to, she is requesting [facility] in the [facility], no specific provider.  Call ID:      PRESCRIPTION REFILL ONLY  Name of prescription:  Pharmacy:

## 2019-01-27 ENCOUNTER — Encounter: Payer: Self-pay | Admitting: Neurology

## 2019-01-29 ENCOUNTER — Other Ambulatory Visit (HOSPITAL_COMMUNITY)
Admission: RE | Admit: 2019-01-29 | Discharge: 2019-01-29 | Disposition: A | Discharge status: Home / Self Care | Payer: Medicaid Other | Source: Ambulatory Visit | Attending: Nurse Practitioner | Admitting: Nurse Practitioner

## 2019-01-29 DIAGNOSIS — Z124 Encounter for screening for malignant neoplasm of cervix: Secondary | ICD-10-CM | POA: Diagnosis present

## 2019-01-30 LAB — CYTOLOGY - PAP: Diagnosis: NEGATIVE

## 2019-02-23 NOTE — Progress Notes (Signed)
Virtual Visit via Video Note The purpose of this virtual visit is to provide medical care while limiting exposure to the novel coronavirus.    Consent was obtained for video visit:  Yes.   Answered questions that patient had about telehealth interaction:  Yes.   I discussed the limitations, risks, security and privacy concerns of performing an evaluation and management service by telemedicine. I also discussed with the patient that there may be a patient responsible charge related to this service. The patient expressed understanding and agreed to proceed.  Pt location: Home Physician Location: office Name of referring provider:  Deetta Perla, MD I connected with Bianca Forbes at patients initiation/request on 02/24/2019 at  7:50 AM EST by video enabled telemedicine application and verified that I am speaking with the correct person using two identifiers. Pt MRN:  [id number] Pt DOB:  [birth year] Video Participants:  Bianca Forbes   History of Present Illness:  Jnyah Brazee is a 23 year old female with panic disorder, PTSD, depression, anxiety, and OCD who presents for muscle spasms and tremor.  History supplemented by referring provider notes.  HEADACHE: 23 years old Episodes dizziness, tinnitus,  Vision would shift.  Anxiety or migraines  Underwent audiometric testing which was reportedly negative.  Auditory processing issues?  Repeat testing normal.    Since childhood, she has experienced muscle spasms, but have been getting worse over the past couple of years.  They are diffuse, involving all extremities, but they are particularly prominent in the temples, back of her head and face.  She describes it as a painful pulsating sensation.  No particular triggers.  Pressing a ball against her temple helps relieve it.  Sometimes shooting pain.  Over the past 2 years, she reports occasional slight body twitching, which is brief and occurs 1 to 2 times a week.  When it occurs in  her limb, she says she can see her muscle "moving" under the skin.  No associated weakness or myalgias.  Occasional numbness and tingling but nothing severe.  She also has tremors in her hands.  Often she is unaware unless somebody brings it to her attention.  It seems to be worse if she grips her hand in a fist.  It does not interfere with daily activities such as writing or using utensils.  No family history of tremor.  She was diagnosed with essential tremor.  Other pertinent history: 23 years old, she passed out and hit her head, possibly vasovagal due to upset stomach.  Caused left sided jaw pain and swelling for a while.    As a child and adolescent, she had episodes of dizziness, tinnitus and visual disturbance (vision would "shift").  At the time, it was uncertain if symptoms were migraine or related to anxiety.  She reports longstanding history of hearing problems.  She states she underwent audiometric testing which initially was abnormal.  It was proposed that she had some sort of auditory processing disorder.  She had follow up testing which was normal.  She also reports fluttering sound in her ear, which ENT said it was related to "muscle spasm" in her ear (likely stapedius muscle contraction).  Because of her other muscle spasms, this has caused her concern.  Current anti-anxiolytic:  Clonazepam  Current Antidepressant/antipsychotic medications:  Pristiq, Wellbutrin  Past muscle relaxants:  Flexeril, Robaxin Past antidepressant medications:  Zolft 150mg , Celexa, Cymbalta 60mg  daily, Remeron, trazodone 50mg   Imaging: MRI BRAIN WO from 12/30/2018 personally reviewed:  normal  Labs: 12/04/2018:  Sed rate 6; TSH 1.62, T3 29, T4 7.1; CMP with Na 138, K 4.4, Cl 104, CO2 9.4, glucose 88, BUN 6, Cr 0.88, t bili 0.3, ALP 36, AST 13, ALT 6; CBC with WBC 3.8, HGB 12.2, HCT 38, PLT 259  B12 reportedly okay.    Past Medical History: Past Medical History:  Diagnosis Date   Depression     GERD (gastroesophageal reflux disease)    Headache(784.0)    Irritable bowel syndrome    Tinnitus    Saw ENT, assoc with emesis when occured    Medications: Outpatient Encounter Medications as of 02/24/2019  Medication Sig   buPROPion (WELLBUTRIN XL) 300 MG 24 hr tablet Take 300 mg by mouth daily.   clonazePAM (KLONOPIN) 0.5 MG tablet TAKE 1 2 TO 1 (ONE HALF TO ONE) TABLET BY MOUTH TWICE DAILY AS NEEDED FOR ANXIETY AND PANIC ATTACK   desvenlafaxine (PRISTIQ) 50 MG 24 hr tablet TAKE 1 TABLET BY MOUTH ONCE DAILY FOR ANXIETY   No facility-administered encounter medications on file as of 02/24/2019.     Allergies: No Known Allergies  Family History: Family History  Problem Relation Age of Onset   Alzheimer's disease Maternal Grandmother        Died at 6274   Seizures Maternal Grandmother    Kidney disease Maternal Grandfather        Died at 6947   Other Paternal Grandfather        Died at 2360 from natural causes   Diabetes Mother        Type 2   Irritable bowel syndrome Father    Lactose intolerance Father    Seizures Cousin        Maternal 1st Cousin    Social History: Social History   Socioeconomic History   Marital status: Single    Spouse name: Not on file   Number of children: Not on file   Years of education: Not on file   Highest education level: Not on file  Occupational History   Not on file  Social Needs   Financial resource strain: Not on file   Food insecurity    Worry: Not on file    Inability: Not on file   Transportation needs    Medical: Not on file    Non-medical: Not on file  Tobacco Use   Smoking status: Never Smoker   Smokeless tobacco: Never Used  Substance and Sexual Activity   Alcohol use: No    Alcohol/week: 0.0 standard drinks   Drug use: No   Sexual activity: Never  Lifestyle   Physical activity    Days per week: Not on file    Minutes per session: Not on file   Stress: Not on file  Relationships    Social connections    Talks on phone: Not on file    Gets together: Not on file    Attends religious service: Not on file    Active member of club or organization: Not on file    Attends meetings of clubs or organizations: Not on file    Relationship status: Not on file   Intimate partner violence    Fear of current or ex partner: Not on file    Emotionally abused: Not on file    Physically abused: Not on file    Forced sexual activity: Not on file  Other Topics Concern   Not on file  Social History Narrative   Bianca Forbes is a [employer]er[employer] 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    She lives with both parents.    Observations/Objective:   Height 5' (1.524 m), weight 112 lb (50.8 kg). No acute distress.  Alert and oriented.  Speech fluent and not dysarthric.  Language intact.  Eyes orthophoric on primary gaze.  Face symmetric.  Assessment and Plan:   1.  Muscle spasms/fasciculations.   2.  Tremor, possible essential tremor.    1.  Check CK and Mg 2.  NCV-EMG 3.  Consider baclofen to treat muscle spasms. 4.  As tremors are not affecting quality of life, would not treat at this time. 5.  Follow up after testing.  Follow Up Instructions:    -I discussed the assessment and treatment plan with the patient. The patient was provided an opportunity to ask questions and all were answered. The patient agreed with the plan and demonstrated an understanding of the instructions.   The patient was advised to call back or seek an in-person evaluation if the symptoms worsen or if the condition fails to improve as anticipated.   Dudley Major, DO

## 2019-02-24 ENCOUNTER — Encounter: Payer: Self-pay | Admitting: Neurology

## 2019-02-24 ENCOUNTER — Other Ambulatory Visit: Payer: Self-pay

## 2019-02-24 ENCOUNTER — Telehealth (INDEPENDENT_AMBULATORY_CARE_PROVIDER_SITE_OTHER): Payer: Medicaid Other | Admitting: Neurology

## 2019-02-24 VITALS — Ht 60.0 in | Wt 112.0 lb

## 2019-02-24 DIAGNOSIS — M62838 Other muscle spasm: Secondary | ICD-10-CM

## 2019-02-24 DIAGNOSIS — G250 Essential tremor: Secondary | ICD-10-CM

## 2019-02-24 MED ORDER — BACLOFEN 5 MG PO TABS: 5.0000 mg | ORAL_TABLET | Freq: Three times a day (TID) | ORAL | 3 refills | Status: DC

## 2019-02-24 NOTE — Addendum Note (Signed)
Addended by: Ranae Plumber on: 02/24/2019 10:22 AM   Modules accepted: Orders

## 2019-03-17 ENCOUNTER — Ambulatory Visit: Payer: Medicaid Other | Admitting: Family Medicine

## 2019-03-19 ENCOUNTER — Encounter: Payer: Medicaid Other | Admitting: Neurology

## 2019-04-08 ENCOUNTER — Telehealth: Payer: Medicaid Other | Admitting: Neurology

## 2019-04-22 ENCOUNTER — Ambulatory Visit (INDEPENDENT_AMBULATORY_CARE_PROVIDER_SITE_OTHER): Payer: Medicaid Other | Admitting: Family Medicine

## 2019-04-22 ENCOUNTER — Encounter: Payer: Self-pay | Admitting: Family Medicine

## 2019-04-22 ENCOUNTER — Other Ambulatory Visit: Payer: Self-pay

## 2019-04-22 VITALS — BP 110/60 | HR 99 | Ht 60.0 in | Wt 114.1 lb

## 2019-04-22 DIAGNOSIS — K581 Irritable bowel syndrome with constipation: Secondary | ICD-10-CM | POA: Diagnosis not present

## 2019-04-22 DIAGNOSIS — F419 Anxiety disorder, unspecified: Secondary | ICD-10-CM

## 2019-04-22 DIAGNOSIS — F411 Generalized anxiety disorder: Secondary | ICD-10-CM | POA: Diagnosis not present

## 2019-04-22 DIAGNOSIS — K921 Melena: Secondary | ICD-10-CM

## 2019-04-22 LAB — POCT HEMOGLOBIN: Hemoglobin: 12.2 g/dL (ref 11–14.6)

## 2019-04-22 NOTE — Patient Instructions (Addendum)
It was a pleasure meeting you today.  You were seen to establish care.  I will check your hemoglobin today and will call you if the results are abnormal.  You can start Vit D supplements, these are over the counter.  I have sent a referral to the chronic management team to help with techniques to decrease some of your anxiety.  I recommend taking Mirlax daily to help with bowel movements and decrease straining   Follow up with me as needed  Dana Allan MD

## 2019-04-22 NOTE — Progress Notes (Signed)
  Patient Name: Bianca Forbes Date of Birth: [birth year] Date of Visit: 04/22/19 PCP: Bianca Allan, MD  Chief Complaint: establish care  Subjective: Bianca Forbes is a pleasant 24 y.o. with medical history significant for Recurrent Ear infections, Major depressive Disorder, Panic Attacks, PTSD, OCD, Anxiety, Tinnitus, Dizziness, Vit D deficiency, IBS presenting today to establish care and blood in stool.   Blood in stool Patient reports blood in stool periodically.  She has noticed that she does have pain when moving her bowels and has had some constipation.  She thinks she may have hemorrhoids due to straining. She is not currently having any bloody stool.  Denies any weakness, SOB or chest pain.  Does have some palpitations periodically that she related to her anxiety. She reports she has never been sexually active. Last PAP 01/28/2019   Bianca Forbes reports that she is currently under the care of Bianca Forbes to help with management of her MDD, Anxiety and PTSD and medications. ROS: Per HPI.   I have reviewed the patient's medical, surgical, family, and social history as appropriate.  Vitals:   04/22/19 1554  BP: 110/60  Pulse: 99  SpO2: 99%    Anoscope: Rectal mucosa normal, no evidence of bleeding or internal hemorrhoids. External anal exam: no external hemorrhoids identified, no lesions noted   Irritable bowel syndrome Given that patient has history of IBS and is straining to have bowel movements, high suspicion that blood in stool is related to increase in strain. Also consider internal and external hemorrhoids -Anoscope negative for hemorrhoids -Add Mirlax  -Avoid straining during bowel movements -Hemoglobin WNL  Anxiety state Followed by Bianca Forbes psychiatry -Continue previous Bianca Forbes and Bianca Forbes  -Refer to [facility] to help with techniques to decrease stress and anxiety  Follow up as needed  Bianca Allan, MD  [facility] Medicine Teaching Service

## 2019-04-24 ENCOUNTER — Telehealth: Payer: Self-pay | Admitting: Psychology

## 2019-04-24 ENCOUNTER — Ambulatory Visit: Payer: Self-pay | Admitting: Licensed Clinical Social Worker

## 2019-04-24 NOTE — Assessment & Plan Note (Signed)
Given that patient has history of IBS and is straining to have bowel movements, high suspicion that blood in stool is related to increase in strain. Also consider internal and external hemorrhoids -Anoscope negative for hemorrhoids -Add Mirlax  -Avoid straining during bowel movements -Hemoglobin WNL

## 2019-04-24 NOTE — Assessment & Plan Note (Signed)
Followed by Dr Malachy Moan psychiatry -Continue previous Wellbutrin XL and Pristiq  -Refer to [facility] to help with techniques to decrease stress and anxiety

## 2019-04-24 NOTE — Chronic Care Management (AMB) (Signed)
   Social Work Care Management  Referral   04/24/2019 Name: Raymond Azure MRN: [id number] DOB: [birth year]  Bianca Forbes is a 23 y.o. year old female who sees Carollee Leitz, MD for primary care.  LCSW was consulted by PCP to assistance patient with relaxation techniques.    Referral Priority: Routine  Review of patient status, including review of consultants reports, relevant laboratory and other test results, as well as collaboration with appropriate care team members,  and the patient's provider was performed as part of comprehensive patient evaluation and provision of chronic care management services.    Recommendation: After chart review by LCSW and Dr. Hartford Poli it is determined that patient's needs can be met in-house by Dr. Hartford Poli.   Plan:  1. Patient is being referred to Dr. Hartford Poli for short-term brief interventions 2. Dr. Hartford Poli will contact the LCSW  if additional coordination is needed or identified    Casimer Lanius, [place] / [place]   [phone] 4:15 PM

## 2019-04-24 NOTE — Telephone Encounter (Signed)
Attempted to call pt to set up [facility] appt with Dr. Shawnee Knapp

## 2019-04-24 NOTE — Telephone Encounter (Signed)
-----   Message from Soundra Pilon, LCSW sent at 04/24/2019  3:46 PM EST ----- Regarding: CCM referral Hi Lurena Joiner,  I received this referral from Dr. Clent Ridges.  Looks like the patient is on medication and just needs some relaxation techniques based on her note.  Take a look and see if you are you interested before I refer her out?  Thanks [employer]

## 2019-04-27 ENCOUNTER — Telehealth: Payer: Self-pay | Admitting: Psychology

## 2019-04-27 NOTE — Telephone Encounter (Signed)
Attempted to call second time for [facility] appt set up. Unable to leave VM

## 2019-05-01 ENCOUNTER — Ambulatory Visit: Payer: Self-pay | Admitting: Licensed Clinical Social Worker

## 2019-05-01 NOTE — Chronic Care Management (AMB) (Signed)
  Care Management   Clinical Social Work Outreach   05/01/2019 Name: Maeley Matton MRN: [id number] DOB: Jan 16, 1996  Seena Ritacco is a 24 y.o. year old female who is a primary care patient of Dana Allan, MD . The Care Management team was consulted for assistance with resources and support for relaxation tech due to stress. LCSW reviewed chart and referred patient to Dr. Shawnee Knapp who had two unsuccessful outreaches to this patient.   LCSW reached out to [employer] today by phone to introduce self and assess needs. Phone is not in service.  Called 2nd number in the chart which is patient's mother.  A HIPPA compliant message was left for the patient providing contact information and requesting a return call.   Plan: LCSW will wait for return call, if no return call is received LCSW will discontinue outreach.  Sammuel Hines, LCSW Clinical Social Worker [facility] / [facility]   [phone] 10:37 AM

## 2019-05-19 ENCOUNTER — Encounter: Payer: Medicaid Other | Admitting: Neurology

## 2019-05-20 ENCOUNTER — Ambulatory Visit (INDEPENDENT_AMBULATORY_CARE_PROVIDER_SITE_OTHER): Payer: Medicaid Other | Admitting: Family Medicine

## 2019-05-20 ENCOUNTER — Other Ambulatory Visit: Payer: Self-pay

## 2019-05-20 ENCOUNTER — Encounter: Payer: Self-pay | Admitting: Family Medicine

## 2019-05-20 ENCOUNTER — Encounter: Payer: Medicaid Other | Admitting: Neurology

## 2019-05-20 VITALS — BP 96/62 | HR 99 | Wt 115.4 lb

## 2019-05-20 DIAGNOSIS — R253 Fasciculation: Secondary | ICD-10-CM

## 2019-05-20 DIAGNOSIS — F329 Major depressive disorder, single episode, unspecified: Secondary | ICD-10-CM | POA: Diagnosis not present

## 2019-05-20 DIAGNOSIS — R7989 Other specified abnormal findings of blood chemistry: Secondary | ICD-10-CM | POA: Diagnosis not present

## 2019-05-20 DIAGNOSIS — F419 Anxiety disorder, unspecified: Secondary | ICD-10-CM

## 2019-05-20 DIAGNOSIS — E559 Vitamin D deficiency, unspecified: Secondary | ICD-10-CM

## 2019-05-20 DIAGNOSIS — F32A Depression, unspecified: Secondary | ICD-10-CM | POA: Insufficient documentation

## 2019-05-20 DIAGNOSIS — M62838 Other muscle spasm: Secondary | ICD-10-CM

## 2019-05-20 NOTE — Patient Instructions (Signed)
It was a pleasure meeting you today.  I am going to send in the referral for neurology.  Also going to check your vitamin D levels given you had low vitamin D at your last check.  Regarding your issues with depression going to give you a paper that includes instructions on how to get help and all of the providers that accept your insurance.  We will follow-up later on your bruising issues.  I have attached some information regarding things to help with acne.   Acne  Acne is a skin problem that causes small, red bumps (pimples) and other skin changes. The skin has tiny holes called pores. Each pore has an oil gland. Acne happens when the pores get blocked. The pores may become red, sore, and swollen. They may also become infected. Acne is common among teenagers. Acne usually goes away over time. What are the causes? This condition may be caused when:  Oil glands get blocked by oil, dead skin cells, and dirt.  Bacteria that live in the oil glands increase in number and cause infection. Acne can start with changes in hormones. These changes can occur:  When children mature into their teens (adolescence).  When women get their period (menstrual cycle).  When women are pregnant. Some things can make acne worse. They include:  Cosmetics and hair products that have oil in them.  Stress.  Diseases that cause changes in hormones.  Some medicines.  Headbands, backpacks, or shoulder pads.  Being near certain oils and chemicals.  Foods that are high in sugars. These include dairy products, sweets, and chocolates. What increases the risk? You are more likely to develop this condition if:  You are a teenager.  You have a family history of acne. What are the signs or symptoms? Symptoms of this condition include:  Small, red bumps (pimples or papules).  Whiteheads.  Blackheads.  Small, pus-filled pimples (pustules).  Big, red pimples or pustules that feel tender. Acne that is very  bad can cause:  An abscess. This is an area that has pus.  Cysts. These are hard, painful sacs that have fluid.  Scars. These can happen after large pimples heal. How is this treated? Treatment for this condition depends on how bad your acne is. It may include:  Creams and lotions. These can: ? Keep the pores of your skin open. ? Prevent infections and swelling.  Medicines that treat infections (antibiotics). These can be put on your skin or taken as pills.  Pills that decrease the amount of oil in your skin.  Birth control pills.  Light or laser treatments.  Shots of medicine into the areas with acne.  Chemicals that cause the skin to peel.  Surgery. Follow these instructions at home: Good skin care is the most important thing you can do to treat your acne. Take care of your skin as told by your doctor. You may be told to do these things:  Wash your skin gently at least two times each day. You should also wash your skin: ? After you exercise. ? Before you go to bed.  Use mild soap.  Use a water-based skin moisturizer after you wash your skin.  Use a sunscreen or sunblock with SPF 30 or greater. This is very important if you are using acne medicines.  Choose cosmetics that will not block your oil glands (are noncomedogenic). Medicines  Take over-the-counter and prescription medicines only as told by your doctor.  If you were prescribed an antibiotic  medicine, use it or take it as told by your doctor. Do not stop using the antibiotic even if your acne gets better. General instructions  Keep your hair clean and off your face. Shampoo your hair on a regular basis. If you have oily hair, you may need to wash it every day.  Avoid wearing tight headbands or hats.  Avoid picking or squeezing your pimples. That can make your acne worse and cause it to scar.  Shave gently. Only shave when you have to.  Keep a food journal. This can help you see if any foods are linked  to your acne.  Keep all follow-up visits as told by your doctor. This is important. Contact a doctor if:  Your acne is not better after eight weeks.  Your acne gets worse.  You have a large area of skin that is red or tender.  You think that you are having side effects from any acne medicine. Summary  Acne is a skin problem that causes pimples. Acne is common among teenagers. Acne usually goes away over time.  Acne starts with changes in your hormones. Other causes include stress, diet, and some medicines.  Follow your doctor's instructions on how to take care of your skin. Good skin care is the most important thing you can do to treat your acne.  Take over-the-counter and prescription medicines only as told by your doctor.  Contact your doctor if you think that you are having side effects from any acne medicine. This information is not intended to replace advice given to you by your health care provider. Make sure you discuss any questions you have with your health care provider. Document Revised: 07/30/2017 Document Reviewed: 07/30/2017 Elsevier Patient Education  2020 [employer].

## 2019-05-20 NOTE — Assessment & Plan Note (Addendum)
Patient reports long history of anxiety and depression since she was approximately 24 years old.  PHQ-9 score today was 14 from 9 at last visit.  Patient denies any SI or HI at this time but reports that she has passive thoughts of harming herself approximately every 2 weeks but she is "never going to do anything".  Patient reports she sees a psychiatrist for this who manages all her medications although she just switched her medication but patient says that she has not started the new medicine.  Patient is interested in seeing a therapist. -Continue psychiatry's recommendations regarding medication -Provided patient information on how to get in touch with the psychologist -Provide patient with suicide hotline number -Discussed strict return precautions with patient and she was agreeable

## 2019-05-20 NOTE — Assessment & Plan Note (Signed)
Patient reports that she feels like she has been stressed and that her muscle spasms have gotten worse.  She says that she has seen the pediatric neurologist but that they are now recommending she see an adult neurologist because she is now 69.  She has made appointments with her adult neurologist for further testing but reports that she needs a referral because of her insurance.  Her appointments are scheduled for early March as well as in April. -Referral has been sent to neurology -May be psychogenic etiology

## 2019-05-20 NOTE — Assessment & Plan Note (Signed)
Patient's last vitamin D level on 12/2018 was 15.  She is concerned that she still has low vitamin D and is unsure if her tiredness is due to vitamin D or to her depression.  Patient would like to check her vitamin D levels. -Recheck vitamin D levels today

## 2019-05-20 NOTE — Progress Notes (Signed)
CHIEF COMPLAINT / HPI:\  Patient presents for referral to neurology.  She says that she has been seen by pediatric neurology for the past 10 years due to excessive muscle twitches.  The twitches get worse when her anxiety is at a higher level.  She says that they told her to go see an adult neurologist because she is now 24.  She already has an appointment scheduled with one of our neurology for testing in early March and then a follow-up visit scheduled for mid April.  Low vitamin D Patient is also concerned because in the past she has had issues with low vitamin D.  She is requesting that she have a vitamin D check because her last one was done in 2018.  She is complaining of fatigue and is unsure if this is because of low vitamin D levels or because of issues with her depression and anxiety.  Depression and anxiety Patient reports a long history of depression and anxiety.  She reports history of MDD, PTSD, and says that she was newly diagnosed with OCD.  She is followed by a psychiatrist who is managing all of her medications.  She denies SI or HI at this time.  She does report thoughts of harming herself approximately every 2 weeks but this is not new.  Is more of a passive thoughts and she said that she has not been suicidal since she was in high school.  PERTINENT  PMH / PSH: Anxiety, depression, essential tremor, muscle spasms  OBJECTIVE: BP 96/62   Pulse 99   Wt 115 lb 6.4 oz (52.3 kg)   LMP 05/04/2019 (Approximate)   SpO2 100%   BMI 22.54 kg/m   General: Patient appears anxious in room she is very fidgety Cardio: Regular rate and rhythm, no murmurs appreciated Respiratory: Clear to auscultation bilaterally, no wheezes noted Psych: Patient appears anxious, she is fidgety, she is able to stay on topic for short periods of time, denies SI or HI at this time.  Reports thoughts of self-harm approximately every 2 weeks with the last one being more than a week ago.  She says that these  are more passive thoughts and she has no plan to actually hurt her self.  Depression screen [facility] 2/9 05/20/2019 05/20/2019 04/22/2019  Decreased Interest 1 1 1   Down, Depressed, Hopeless 1 1 2   PHQ - 2 Score 2 2 3   Altered sleeping 3 - 2  Tired, decreased energy 2 - 2  Change in appetite 2 - 1  Feeling bad or failure about yourself  1 - 0  Trouble concentrating 2 - 0  Moving slowly or fidgety/restless 1 - 0  Suicidal thoughts 1 - 0  PHQ-9 Score 14 - 8  Difficult doing work/chores Very difficult - Very difficult    ASSESSMENT / PLAN:  Anxiety and depression Patient reports long history of anxiety and depression since she was approximately 24 years old.  PHQ-9 score today was 14 from 9 at last visit.  Patient denies any SI or HI at this time but reports that she has passive thoughts of harming herself approximately every 2 weeks but she is "never going to do anything".  Patient reports she sees a psychiatrist for this who manages all her medications although she just switched her medication but patient says that she has not started the new medicine.  Patient is interested in seeing a therapist. -Continue psychiatry's recommendations regarding medication -Provided patient information on how to get in touch with  the psychologist -Provide patient with suicide hotline number -Discussed strict return precautions with patient and she was agreeable  Vitamin D insufficiency Patient's last vitamin D level on 12/2018 was 15.  She is concerned that she still has low vitamin D and is unsure if her tiredness is due to vitamin D or to her depression.  Patient would like to check her vitamin D levels. -Recheck vitamin D levels today  Night muscle spasms Patient reports that she feels like she has been stressed and that her muscle spasms have gotten worse.  She says that she has seen the pediatric neurologist but that they are now recommending she see an adult neurologist because she is now 24.  She has made  appointments with her adult neurologist for further testing but reports that she needs a referral because of her insurance.  Her appointments are scheduled for early March as well as in April. -Referral has been sent to neurology -May be psychogenic etiology    Gifford Shave, MD [place]

## 2019-05-21 LAB — VITAMIN D 25 HYDROXY (VIT D DEFICIENCY, FRACTURES): Vit D, 25-Hydroxy: 24.3 ng/mL — ABNORMAL LOW (ref 30.0–100.0)

## 2019-05-21 NOTE — Telephone Encounter (Signed)
Called patient regarding her vitamin D levels.  Informed her that they were higher than they had been in the past but that they were still mildly low.  Patient reports that she has not been taking her vitamin D supplementation but that she has some.  She says that she will start taking it again.  She reports no other questions or concerns.

## 2019-06-08 ENCOUNTER — Encounter (HOSPITAL_COMMUNITY): Payer: Self-pay | Admitting: Psychiatry

## 2019-06-08 ENCOUNTER — Ambulatory Visit (INDEPENDENT_AMBULATORY_CARE_PROVIDER_SITE_OTHER): Payer: Medicaid Other | Admitting: Psychiatry

## 2019-06-08 DIAGNOSIS — F411 Generalized anxiety disorder: Secondary | ICD-10-CM | POA: Diagnosis not present

## 2019-06-08 DIAGNOSIS — F332 Major depressive disorder, recurrent severe without psychotic features: Secondary | ICD-10-CM

## 2019-06-08 DIAGNOSIS — F4310 Post-traumatic stress disorder, unspecified: Secondary | ICD-10-CM | POA: Diagnosis not present

## 2019-06-08 NOTE — Progress Notes (Signed)
Psychiatric Initial Adult Assessment   Patient Identification: Bianca Forbes MRN:  [id number] Date of Evaluation:  06/08/2019 Referral Source: Self Chief Complaint:   Visit Diagnosis:    ICD-10-CM   1. Severe episode of recurrent major depressive disorder, without psychotic features ([place])  F33.2   2. PTSD (post-traumatic stress disorder)  F43.10   3. GAD (generalized anxiety disorder)  F41.1     History of Present Illness: Patient is a 24 year old female who has been struggling with depression since age 10, reports that she started  psychotropic medication at age 26, adds it might have helped on and off for a few weeks but nothing has been helping for the past 6 months.  Patient reports that she is currently on Wellbutrin XL, Pristiq and Klonopin as needed.  Patient adds that she feels overwhelmed all the time, has problems with concentration, energy, feels hopeless and helpless at times but denies any suicidal thoughts.  Patient reports that she also had genetic testing done to see response to medication but has no benefit with the medications.  She has that she wants to get [place] so she can do better, have her depression go away.  She states that she currently works [employer] her job, but is not able to function because of her depression.  Associated Signs/Symptoms: Depression Symptoms:  depressed mood, insomnia, psychomotor agitation, fatigue, feelings of worthlessness/guilt, difficulty concentrating, hopelessness, anxiety, loss of energy/fatigue, disturbed sleep, weight loss, weight gain, (Hypo) Manic Symptoms:  Distractibility, Impulsivity, Anxiety Symptoms:  Excessive Worry, Panic Symptoms, Psychotic Symptoms:  Hallucinations: None PTSD Symptoms: Had a traumatic exposure:  in the past  Past Psychiatric History: As mentioned previously, patient has diagnoses of PTSD, major depressive disorder and generalized anxiety disorder.  Patient sees Dr. Darleene Forbes for outpatient  follow-up, has tried various medications since age 65 which include Zoloft, had no benefit, Celexa, Prozac, Remeron, Cymbalta which made her have dry mouth, trazodone.  Patient is currently on Wellbutrin, Pristiq and Klonopin  Previous Psychotropic Medications: Yes   Substance Abuse History in the last 12 months:  No.  Consequences of Substance Abuse: Negative  Past Medical History:  Past Medical History:  Diagnosis Date  . Depression   . GERD (gastroesophageal reflux disease)   . Headache(784.0)   . Irritable bowel syndrome   . Tinnitus    Saw ENT, assoc with emesis when occured   No past surgical history on file.  Family Psychiatric History: Patient reports that her mother suffers from depression and is doing well on Cymbalta.  Family History:  Family History  Problem Relation Age of Onset  . Alzheimer's disease Maternal Grandmother        Died at 38  . Seizures Maternal Grandmother   . Kidney disease Maternal Grandfather        Died at 60  . Other Paternal Grandfather        Died at 51 from natural causes  . Diabetes Mother        Type 2  . Irritable bowel syndrome Father   . Lactose intolerance Father   . Seizures Cousin        Maternal 1st Cousin    Social History:   Social History   Socioeconomic History  . Marital status: Single    Spouse name: Not on file  . Number of children: 0  . Years of education: Not on file  . Highest education level: Bachelor's degree (e.g., BA, AB, BS)  Occupational History  . Not on file  Tobacco Use  . Smoking status: Never Smoker  . Smokeless tobacco: Never Used  Substance and Sexual Activity  . Alcohol use: No    Alcohol/week: 0.0 standard drinks  . Drug use: No  . Sexual activity: Never  Other Topics Concern  . 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    She lives with  both parents.   Right handed      Social Determinants of Health   Financial Resource Strain:   . Difficulty of Paying Living Expenses: Not on file  Food Insecurity:   . Worried About Programme researcher, broadcasting/film/video in the Last Year: Not on file  . Ran Out of Food in the Last Year: Not on file  Transportation Needs:   . Lack of Transportation (Medical): Not on file  . Lack of Transportation (Non-Medical): Not on file  Physical Activity:   . Days of Exercise per Week: Not on file  . Minutes of Exercise per Session: Not on file  Stress:   . Feeling of Stress : Not on file  Social Connections:   . Frequency of Communication with Friends and Family: Not on file  . Frequency of Social Gatherings with Friends and Family: Not on file  . Attends Religious Services: Not on file  . Active Member of Clubs or Organizations: Not on file  . Attends Banker Meetings: Not on file  . Marital Status: Not on file    Additional Social History: Patient currently works for [employer]  Allergies:  No Known Allergies  Metabolic Disorder Labs: Lab Results  Component Value Date   HGBA1C 5.4 06/06/2016   MPG 108 06/06/2016   MPG 103 01/10/2016   No results found for: PROLACTIN Lab Results  Component Value Date   CHOL 114 (L) 05/24/2015   TRIG 69 05/21/2014   HDL 50 05/24/2015   CHOLHDL 2.6 05/21/2014   VLDL 14 05/21/2014   LDLCALC 52 05/21/2014   Lab Results  Component Value Date   TSH 1.62 12/04/2018    Therapeutic Level Labs: No results found for: LITHIUM No results found for: CBMZ No results found for: VALPROATE  Current Medications: Current Outpatient Medications  Medication Sig Dispense Refill  . Baclofen 5 MG TABS Take 5 mg by mouth 3 (three) times daily. 90 tablet 3  . buPROPion (WELLBUTRIN XL) 300 MG 24 hr tablet Take 300 mg by mouth daily.  1  . clonazePAM (KLONOPIN) 0.5 MG tablet TAKE 1 2 TO 1 (ONE HALF TO ONE) TABLET BY MOUTH TWICE DAILY AS NEEDED FOR ANXIETY AND  PANIC ATTACK    . desvenlafaxine (PRISTIQ) 50 MG 24 hr tablet TAKE 1 TABLET BY MOUTH ONCE DAILY FOR ANXIETY     No current facility-administered medications for this visit.    Musculoskeletal: Strength & Muscle Tone: within normal limits Gait & Station: normal Patient leans: N/A  Psychiatric Specialty Exam: Review of Systems  Constitutional: Positive for activity change, appetite change and fatigue. Negative for chills and diaphoresis.  HENT: Negative.  Negative for congestion and sore throat.   Eyes: Negative.  Negative for discharge, redness and itching.  Respiratory: Negative.  Negative for shortness of breath and wheezing.   Gastrointestinal: Negative.   Endocrine: Negative.   Musculoskeletal: Negative.   Skin: Negative.   Allergic/Immunologic: Negative.  Negative for environmental allergies.  Psychiatric/Behavioral: Positive for decreased concentration, dysphoric mood and sleep disturbance. Negative for agitation, behavioral problems, confusion, hallucinations, self-injury and suicidal ideas. The patient is nervous/anxious. The patient is not hyperactive.     There were no vitals taken for this visit.There is no height or weight on file to calculate BMI.  General Appearance: Casual  Eye Contact:  Fair  Speech:  Clear and Coherent and Normal Rate  Volume:  Normal  Mood:  Anxious, Depressed and Hopeless  Affect:  Appropriate, Congruent and Depressed  Thought Process:  Coherent, Goal Directed and Descriptions of Associations: Intact  Orientation:  Full (Time, Place, and Person)  Thought Content:  Logical, Hallucinations: None and Rumination  Suicidal Thoughts:  No  Homicidal Thoughts:  No  Memory:  Immediate;   Fair Recent;   Fair Remote;   Fair  Judgement:  Intact  Insight:  Present  Psychomotor Activity:  Normal and Mannerisms  Concentration:  Concentration: Fair and Attention Span: Fair  Recall:  [employer] of Knowledge:Fair  Language: Fair  Akathisia:  No  Handed:   Right  AIMS (if indicated):  not done  Assets:  Communication Skills Desire for Improvement Financial Resources/Insurance Housing Physical Health Talents/Skills  ADL's:  Intact  Cognition: WNL  Sleep:  Poor   Screenings: PHQ2-9     [facility] Visit from [facility]/08/2019 in [facility] Office Visit from 05/20/2019 in [facility] Office Visit from 04/22/2019 in [facility] Nutrition [facility] 07/22/2014 in [facility]  PHQ-2 Total Score  6  2  3   0  PHQ-9 Total Score  21  14  8   --      Assessment and Plan: Patient is a 24 year old female with a diagnosis of PTSD, major depressive disorder, recurrent, severe with been tried on various psychotropic medications since age 56, has progressively declined in her ability to function with worsening of depression.  Patient needs TMS based on the assessment today, her lack of response to psychotropic medication and her worsening of depression which is being documented by PHQ-9's starting 04/22/2019 to 06/08/2019.  Also referred patient to EAP program to help get counseling.  Patient reports that she has had therapy in the past with no benefit. 50% of this visit was spent in obtaining history, patient's response to medications, therapies tried, also discussed patient's genetic testing and response to medications.  Patient would benefit from TMS to help her depression remit.  04/24/2019, MD 3/8/202112:22 PM

## 2019-06-09 ENCOUNTER — Ambulatory Visit (INDEPENDENT_AMBULATORY_CARE_PROVIDER_SITE_OTHER): Payer: No Typology Code available for payment source | Admitting: Neurology

## 2019-06-09 ENCOUNTER — Other Ambulatory Visit: Payer: Self-pay

## 2019-06-09 DIAGNOSIS — M62838 Other muscle spasm: Secondary | ICD-10-CM | POA: Diagnosis not present

## 2019-06-09 DIAGNOSIS — G250 Essential tremor: Secondary | ICD-10-CM

## 2019-06-09 NOTE — Procedures (Signed)
[facility]  [address] [place], [address]  [place], [place] [address] Tel: [phone] Fax:  [phone] Test Date:  06/09/2019  Patient: Bianca Forbes DOB: 1996/02/24 Physician: Nita Sickle, DO  Sex: Female Height: 5\' 0"  Ref Phys: , D.O.  ID#: Shon Millet Temp: 33.0C Technician:    Patient Complaints: This is a 24 year old female referred for evaluation of muscle twitches and myalgias.  NCV & EMG Findings: Extensive electrodiagnostic testing of the right upper and lower extremity shows:  1. All sensory responses including the right median, ulnar, mixed palmar, sural, and superficial peroneal nerves are within normal limits. 2. All motor responses including the right median, ulnar, peroneal, and tibial nerves are within normal limits. 3. Right tibial H reflex study is within normal limits. 4. There is no evidence of active or chronic motor axonal loss changes affecting any of the tested muscles.  Motor unit configuration and recruitment pattern is within normal limits.  Impression: This is a normal study of the right upper and lower extremities.  In particular, there is no evidence of a diffuse myopathy, sensorimotor neuropathy, or cervical/lumbosacral radiculopathy.   ___________________________ 36, DO    Nerve Conduction Studies Anti Sensory Summary Table   Stim Site NR Peak (ms) Norm Peak (ms) P-T Amp (V) Norm P-T Amp  Right Median Anti Sensory (2nd Digit)  Wrist    2.9 <3.3 90.4 >20  Right Sup Peroneal Anti Sensory (Ant Lat Mall)  32C  12 cm    2.3 <4.4 42.7 >6  Right Sural Anti Sensory (Lat Mall)  32C  Calf    3.0 <4.4 28.5 >6  Right Ulnar Anti Sensory (5th Digit)  Wrist    3.0 <3.0 55.7 >18   Motor Summary Table   Stim Site NR Onset (ms) Norm Onset (ms) O-P Amp (mV) Norm O-P Amp Site1 Site2 Delta-0 (ms) Dist (cm) Vel (m/s) Norm Vel (m/s)  Right Median Motor (Abd Poll Brev)  Wrist    2.6 <3.9 17.3 >6 Elbow Wrist 4.4 26.0 59 >51    Elbow    7.0  16.9         Right Peroneal Motor (Ext Dig Brev)  32C  Ankle    2.9 <5.5 5.0 >3 B Fib Ankle 6.9 35.0 51 >41  B Fib    9.8  4.7  Poplt B Fib 1.1 7.0 64 >41  Poplt    10.9  4.7         Right Tibial Motor (Abd Hall Brev)  32C  Ankle    4.9 <5.8 14.2 >8 Knee Ankle 8.9 38.0 43 >41  Knee    13.8  12.3         Right Ulnar Motor (Abd Dig Minimi)  Wrist    2.3 <3.0 10.0 >8 B Elbow Wrist 3.2 19.0 59 >51  B Elbow    5.5  9.4  A Elbow B Elbow 1.8 10.0 56 >51  A Elbow    7.3  9.0          Comparison Summary Table   Stim Site NR Peak (ms) Norm Peak (ms) P-T Amp (V) Site1 Site2 Delta-P (ms) Norm Delta (ms)  Right Median/Ulnar Palm Comparison (Wrist - 8cm)  Median Palm    1.8 <2.2 70.2 Median Palm Ulnar Palm 0.1   Ulnar Palm    1.9 <2.2 47.0       H Reflex Studies   NR H-Lat (ms) Lat Norm (ms) L-R H-Lat (ms)  Right Tibial (  Gastroc)  32C     25.85 <35    EMG   Side Muscle Ins Act Fibs Psw Fasc Number Recrt Dur Dur. Amp Amp. Poly Poly. Comment  Right 1stDorInt Nml Nml Nml Nml Nml Nml Nml Nml Nml Nml Nml Nml N/A  Right PronatorTeres Nml Nml Nml Nml Nml Nml Nml Nml Nml Nml Nml Nml N/A  Right Biceps Nml Nml Nml Nml Nml Nml Nml Nml Nml Nml Nml Nml N/A  Right Triceps Nml Nml Nml Nml Nml Nml Nml Nml Nml Nml Nml Nml N/A  Right Deltoid Nml Nml Nml Nml Nml Nml Nml Nml Nml Nml Nml Nml N/A  Right AntTibialis Nml Nml Nml Nml Nml Nml Nml Nml Nml Nml Nml Nml N/A  Right Gastroc Nml Nml Nml Nml Nml Nml Nml Nml Nml Nml Nml Nml N/A  Right Flex Dig Long Nml Nml Nml Nml Nml Nml Nml Nml Nml Nml Nml Nml N/A  Right RectFemoris Nml Nml Nml Nml Nml Nml Nml Nml Nml Nml Nml Nml N/A  Right GluteusMed Nml Nml Nml Nml Nml Nml Nml Nml Nml Nml Nml Nml N/A  Right Lumbo Parasp Low Nml Nml Nml Nml Nml Nml Nml Nml Nml Nml Nml Nml N/A      Waveforms:

## 2019-07-13 ENCOUNTER — Telehealth (HOSPITAL_COMMUNITY): Payer: Self-pay | Admitting: Emergency Medicine

## 2019-07-20 NOTE — Progress Notes (Signed)
Virtual Visit via Video Note The purpose of this virtual visit is to provide medical care while limiting exposure to the novel coronavirus.    Consent was obtained for video visit:  Yes.   Answered questions that patient had about telehealth interaction:  Yes.   I discussed the limitations, risks, security and privacy concerns of performing an evaluation and management service by telemedicine. I also discussed with the patient that there may be a patient responsible charge related to this service. The patient expressed understanding and agreed to proceed.  Pt location: Home Physician Location: office Name of referring provider:  Deetta Perla, MD I connected with Allayne Stack at patients initiation/request on 07/21/2019 at 10:10 AM EDT by video enabled telemedicine application and verified that I am speaking with the correct person using two identifiers. Pt MRN:  [id number] Pt DOB:  May 20, 1995 Video Participants:  Allayne Stack   History of Present Illness:  Bianca Forbes is a 24 year old female with panic disorder, PTSD, depression, anxiety, and OCD who follows up for muscle spasms and tremor.  History supplemented by referring provider notes.  UPDATE: NCV-EMG of lower extremities to evaluate muscle spasms was normal. She reports increased muscle spasms of her face over the past few days.  She was prescribed baclofen which was ineffective.   HISTORY: Since childhood, she has experienced muscle spasms, but have been getting worse over the past couple of years.  They are diffuse, involving all extremities, but they are particularly prominent in the temples, back of her head and face.  She describes it as a painful pulsating sensation.  No particular triggers.  Pressing a ball against her temple helps relieve it.  Sometimes shooting pain.  Over the past 2 years, she reports occasional slight body twitching, which is brief and occurs 1 to 2 times a week.  When it occurs in  her limb, she says she can see her muscle "moving" under the skin.  No associated weakness or myalgias.  Occasional numbness and tingling but nothing severe.  She also has tremors in her hands.  Often she is unaware unless somebody brings it to her attention.  It seems to be worse if she grips her hand in a fist.  It does not interfere with daily activities such as writing or using utensils.  No family history of tremor.  She was diagnosed with essential tremor.  Other pertinent history: 24 years old, she passed out and hit her head, possibly vasovagal due to upset stomach.  Caused left sided jaw pain and swelling for a while.    As a child and adolescent, she had episodes of dizziness, tinnitus and visual disturbance (vision would "shift").  At the time, it was uncertain if symptoms were migraine or related to anxiety.  She reports longstanding history of hearing problems.  She states she underwent audiometric testing which initially was abnormal.  It was proposed that she had some sort of auditory processing disorder.  She had follow up testing which was normal.  She also reports fluttering sound in her ear, which ENT said it was related to "muscle spasm" in her ear (likely stapedius muscle contraction).  Because of her other muscle spasms, this has caused her concern.   Past muscle relaxants:  Flexeril, Robaxin Past antidepressant medications:  Zolft 150mg , Celexa, Cymbalta 60mg  daily, Remeron, trazodone 50mg   Imaging: MRI BRAIN WO from 12/30/2018 personally reviewed:  normal  Labs: Sed rate 6; TSH 1.62, T3 29, T4 7.1;  B12  reportedly okay  Past Medical History: Past Medical History:  Diagnosis Date   Depression    GERD (gastroesophageal reflux disease)    Headache(784.0)    Irritable bowel syndrome    Tinnitus    Saw ENT, assoc with emesis when occured    Medications: Outpatient Encounter Medications as of 07/21/2019  Medication Sig   Baclofen 5 MG TABS Take 5 mg by  mouth 3 (three) times daily.   buPROPion (WELLBUTRIN XL) 300 MG 24 hr tablet Take 300 mg by mouth daily.   clonazePAM (KLONOPIN) 0.5 MG tablet TAKE 1 2 TO 1 (ONE HALF TO ONE) TABLET BY MOUTH TWICE DAILY AS NEEDED FOR ANXIETY AND PANIC ATTACK   desvenlafaxine (PRISTIQ) 50 MG 24 hr tablet TAKE 1 TABLET BY MOUTH ONCE DAILY FOR ANXIETY   No facility-administered encounter medications on file as of 07/21/2019.    Allergies: No Known Allergies  Family History: Family History  Problem Relation Age of Onset   Alzheimer's disease Maternal Grandmother        Died at 20   Seizures Maternal Grandmother    Kidney disease Maternal Grandfather        Died at 15   Other Paternal Grandfather        Died at 20 from natural causes   Diabetes Mother        Type 2   Irritable bowel syndrome Father    Lactose intolerance Father    Seizures Cousin        Maternal 1st Cousin    Social History: Social History   Socioeconomic History   Marital status: Single    Spouse name: Not on file   Number of children: 0   Years of education: Not on file   Highest education level: Bachelor's degree (e.g., BA, AB, BS)  Occupational History   Not on file  Tobacco Use   Smoking status: Never Smoker   Smokeless tobacco: Never Used  Substance and Sexual Activity   Alcohol use: No    Alcohol/week: 0.0 standard drinks   Drug use: No   Sexual activity: Never  Other Topics Concern   Not on file  Social History Narrative   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    She lives with both parents.   Right handed      Social Determinants of Health   Financial Resource Strain:    Difficulty of Paying Living Expenses:   Food Insecurity:    Worried About Charity fundraiser in the Last Year:    Arboriculturist in the Last Year:   Transportation Needs:    Lexicographer (Medical):    Lack of Transportation (Non-Medical):   Physical Activity:    Days of Exercise per Week:    Minutes of Exercise per Session:   Stress:    Feeling of Stress :   Social Connections:    Frequency of Communication with Friends and Family:    Frequency of Social Gatherings with Friends and Family:    Attends Religious Services:    Active Member of Clubs or Organizations:    Attends Archivist Meetings:    Marital Status:   Intimate Partner Violence:    Fear of Current or Ex-Partner:    Emotionally Abused:    Physically Abused:    Sexually Abused:  Observations/Objective:   There were no vitals taken for this visit. No acute distress.  Alert and oriented.  Speech fluent and not dysarthric.  Language intact.  Eyes orthophoric on primary gaze.  Face symmetric.  Assessment and Plan:   1.  Muscle spasms/twitches.  EMG normal.  I really don't have an explanation for her symptoms.  We will check some blood work and I will need her to come into the office for a physical exam. 2.  Tremor, possibly essential tremor  1.  Check CK, Mg, D 2.  She plans to pursue acupuncture 3.  Follow up after testing in office.  If her exam is unremarkable, consider referral to the neuromuscular clinic at [facility].  Follow Up Instructions:    -I discussed the assessment and treatment plan with the patient. The patient was provided an opportunity to ask questions and all were answered. The patient agreed with the plan and demonstrated an understanding of the instructions.   The patient was advised to call back or seek an in-person evaluation if the symptoms worsen or if the condition fails to improve as anticipated.   Cira Servant, DO

## 2019-07-21 ENCOUNTER — Telehealth (INDEPENDENT_AMBULATORY_CARE_PROVIDER_SITE_OTHER): Payer: No Typology Code available for payment source | Admitting: Neurology

## 2019-07-21 ENCOUNTER — Other Ambulatory Visit: Payer: Self-pay

## 2019-07-21 DIAGNOSIS — R251 Tremor, unspecified: Secondary | ICD-10-CM

## 2019-07-21 DIAGNOSIS — M62838 Other muscle spasm: Secondary | ICD-10-CM | POA: Diagnosis not present

## 2019-07-22 ENCOUNTER — Telehealth: Payer: Self-pay | Admitting: Neurology

## 2019-07-22 NOTE — Telephone Encounter (Signed)
Pt states she was to have a referral to [facility] but had to be seen in person before we can send the referral to [facility]. She is sch for 11-30-19 and is on a wait list. She wants to get the referral done ASAP.  Please advise

## 2019-07-22 NOTE — Telephone Encounter (Signed)
Pt appointment was moved to 07-24-2019 due to a cancellation

## 2019-07-23 NOTE — Progress Notes (Signed)
NEUROLOGY FOLLOW UP OFFICE NOTE  Bianca Forbes [id number]  HISTORY OF PRESENT ILLNESS: Bianca Forbes is a 24 year old female with panic disorder, PTSD, depression, anxiety, and OCD who follows up formuscle spasms and tremor. History supplemented by referring provider notes.  UPDATE: I had Bianca Forbes come to the office so that I may perform a neurologic exam to evaluate for any findings that may point to a specific diagnosis for her symptoms.     HISTORY: Since childhood, she has experienced muscle spasms, but have been getting worse over the past couple of years. They are diffuse, involving all extremities, but they are particularly prominent in the temples, back of her head and face. She describes it as a painful pulsating sensation. No particular triggers. Pressing a ball against her temple helps relieve it. Sometimes shooting pain. Over the past 2 years, she reports occasional slight body twitching, which is brief and occurs 1 to 2 times a week. When it occurs in her limb, she says she can see her muscle "moving" under the skin. No associated weakness or myalgias. Occasional numbness and tingling but nothing severe.  NCV-EMG of lower extremities to evaluate muscle spasms was normal. She reports increased muscle spasms of her face over the past few days.  She was prescribed baclofen which was ineffective.  She also has tremors in her hands. Often she is unaware unless somebody brings it to her attention. It seems to be worse if she grips her hand in a fist. It does not interfere with daily activities such as writing or using utensils. No family history of tremor. She was diagnosed with essential tremor.  Other pertinent history: 24 years old, she passed out and hit her head, possibly vasovagal due to upset stomach. Caused left sided jaw pain and swelling for a while.   As a child and adolescent, she had episodes of dizziness, tinnitus and visual  disturbance (vision would "shift"). At the time, it was uncertain if symptoms were migraine or related to anxiety.  She reports longstanding history of hearing problems. She states she underwent audiometric testing which initially was abnormal. It was proposed that she had some sort of auditory processing disorder. She had follow up testing which was normal. She also reports fluttering sound in her ear, which ENT said it was related to "muscle spasm" in her ear (likely stapedius muscle contraction).Because of her other muscle spasms, this has caused her concern.   Past muscle relaxants:Flexeril, Robaxin Past antidepressant medications:Zolft 150mg , Celexa, Cymbalta 60mg  daily, Remeron, trazodone 50mg   Imaging: MRI BRAIN WOfrom 9/29/2020personally reviewed: normal  Labs: Sed rate 6; TSH 1.62, T3 29, T4 7.1;  B12 reportedly okay  PAST MEDICAL HISTORY: Past Medical History:  Diagnosis Date  . Depression   . GERD (gastroesophageal reflux disease)   . Headache(784.0)   . Irritable bowel syndrome   . Tinnitus    Saw ENT, assoc with emesis when occured    MEDICATIONS: Current Outpatient Medications on File Prior to Visit  Medication Sig Dispense Refill  . Baclofen 5 MG TABS Take 5 mg by mouth 3 (three) times daily. 90 tablet 3  . buPROPion (WELLBUTRIN XL) 300 MG 24 hr tablet Take 300 mg by mouth daily.  1  . clonazePAM (KLONOPIN) 0.5 MG tablet TAKE 1 2 TO 1 (ONE HALF TO ONE) TABLET BY MOUTH TWICE DAILY AS NEEDED FOR ANXIETY AND PANIC ATTACK    . desvenlafaxine (PRISTIQ) 50 MG 24 hr tablet TAKE 1 TABLET BY MOUTH ONCE  DAILY FOR ANXIETY     No current facility-administered medications on file prior to visit.    ALLERGIES: No Known Allergies  FAMILY HISTORY: Family History  Problem Relation Age of Onset  . Alzheimer's disease Maternal Grandmother        Died at 64  . Seizures Maternal Grandmother   . Kidney disease Maternal Grandfather        Died at 66  . Other  Paternal Grandfather        Died at 4 from natural causes  . Diabetes Mother        Type 2  . Irritable bowel syndrome Father   . Lactose intolerance Father   . Seizures Cousin        Maternal 1st Cousin    SOCIAL HISTORY: Social History   Socioeconomic History  . Marital status: Single    Spouse name: Not on file  . Number of children: 0  . Years of education: Not on file  . Highest education level: Bachelor's degree (e.g., BA, AB, BS)  Occupational History  . Not on file  Tobacco Use  . Smoking status: Never Smoker  . Smokeless tobacco: Never Used  Substance and Sexual Activity  . Alcohol use: No    Alcohol/week: 0.0 standard drinks  . Drug use: No  . Sexual activity: Never  Other Topics Concern  . 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    She lives with both parents.   Right handed      Social Determinants of Health   Financial Resource Strain:   . Difficulty of Paying Living Expenses:   Food Insecurity:   . Worried About Charity fundraiser in the Last Year:   . Arboriculturist in the Last Year:   Transportation Needs:   . Film/video editor (Medical):   Marland Kitchen Lack of Transportation (Non-Medical):   Physical Activity:   . Days of Exercise per Week:   . Minutes of Exercise per Session:   Stress:   . Feeling of Stress :   Social Connections:   . Frequency of Communication with Friends and Family:   . Frequency of Social Gatherings with Friends and Family:   . Attends Religious Services:   . Active Member of Clubs or Organizations:   . Attends Archivist Meetings:   Marland Kitchen Marital Status:   Intimate Partner Violence:   . Fear of Current or Ex-Partner:   . Emotionally Abused:   Marland Kitchen Physically Abused:   . Sexually Abused:     PHYSICAL EXAM: Blood pressure (!) 99/59, pulse 100, height 5' (1.524 m),  weight 113 lb (51.3 kg), SpO2 99 %. General: No acute distress.  Patient appears well-groomed.   Head:  Normocephalic/atraumatic Eyes:  Fundi examined but not visualized Neck: supple, no paraspinal tenderness, full range of motion Heart:  Regular rate and rhythm Lungs:  Clear to auscultation bilaterally Back: No paraspinal tenderness Neurological Exam: alert and oriented to person, place, and time. Attention span and concentration intact, recent and remote memory intact, fund of knowledge intact.  Speech fluent and not dysarthric, language intact.  CN II-XII intact. Bulk and tone normal, muscle strength 5/5 throughout.  No tremor noted.  Sensation to pinprick and vibration intact.  Deep tendon reflexes 2+ throughout, toes downgoing.  Finger to nose and heel to shin testing intact.  Gait normal, able to turn and tandem walk.  Romberg negative.  IMPRESSION: 1.  Muscle spasms/twitches.  EMG normal.  Neurologic exam normal.  We discussed that her symptoms may be related to emotional stress.  She would like a referral to [facility] in order to make sure that nothing was missed.   2.  Panic disorder  PLAN: 1.  Referral to [facility] 2.  Check CK and Mg  Shon Millet, DO  CC: Dana Allan, MD

## 2019-07-24 ENCOUNTER — Other Ambulatory Visit: Payer: No Typology Code available for payment source

## 2019-07-24 ENCOUNTER — Ambulatory Visit (INDEPENDENT_AMBULATORY_CARE_PROVIDER_SITE_OTHER): Payer: No Typology Code available for payment source | Admitting: Neurology

## 2019-07-24 ENCOUNTER — Encounter: Payer: Self-pay | Admitting: Neurology

## 2019-07-24 ENCOUNTER — Other Ambulatory Visit: Payer: Self-pay

## 2019-07-24 VITALS — BP 99/59 | HR 100 | Ht 60.0 in | Wt 113.0 lb

## 2019-07-24 DIAGNOSIS — M62838 Other muscle spasm: Secondary | ICD-10-CM | POA: Diagnosis not present

## 2019-07-24 DIAGNOSIS — F410 Panic disorder [episodic paroxysmal anxiety] without agoraphobia: Secondary | ICD-10-CM

## 2019-07-24 LAB — MAGNESIUM: Magnesium: 2.1 mg/dL (ref 1.5–2.5)

## 2019-07-24 LAB — VITAMIN D 25 HYDROXY (VIT D DEFICIENCY, FRACTURES): VITD: 13.41 ng/mL — ABNORMAL LOW (ref 30.00–100.00)

## 2019-07-24 LAB — CK: Total CK: 129 U/L (ref 7–177)

## 2019-07-24 NOTE — Patient Instructions (Signed)
1.  I will refer you to [facility] 2.  We will check CK and Mg levels.  I will let you know the results when they are available.

## 2019-07-28 ENCOUNTER — Ambulatory Visit (INDEPENDENT_AMBULATORY_CARE_PROVIDER_SITE_OTHER): Payer: No Typology Code available for payment source | Admitting: Psychiatry

## 2019-07-28 ENCOUNTER — Other Ambulatory Visit: Payer: Self-pay

## 2019-07-28 DIAGNOSIS — F332 Major depressive disorder, recurrent severe without psychotic features: Secondary | ICD-10-CM | POA: Diagnosis not present

## 2019-07-28 NOTE — Progress Notes (Signed)
Pt reported to [facility] for cortical mapping and motor threshold determination for Repetitive Transcranial Magnetic Stimulation treatment for Major Depressive Disorder. Pt completed a PHQ-9 with a score of 24(severe depression). Pt also completed a Beck's Depression Inventory with a score of 33(severe depression). Prior to procedure, pt signed an informed consent agreement for TMS treatment. Pt's treatment area was found by applying single pulses to her left motor cortex, hunting along the anterior/posterior plane and along the superior oblique angle until the best motor response was elicited from the pt's right thumb. The best response was observed at 7.0 cmA/P and 30degrees SOA, with a coil angle of 0degrees. Pt's motor threshold was calculated using the [employer]'s proprietary MT Assist algorithm, which produced a calculated motor threshold of 1.23SMT. Per these findings, pt's treatment parameters are as follows: A/P -- 12.5cm, SOA -- 30degrees, Coil Angle -- 0degrees, Motor Threshold -- 1.11SMT. With these parameters, the pt will receive 36 sessions of TMS according to the following protocol: 3000 pulses per session, with stimulation in bursts of pulses lasting 4 seconds at a frequency of 10 Hz, separated by 26 seconds of rest. After determining pt's tx parameters, coil was moved to the treatment location, and the first burst of pulses was applied at a reduced power of 80% MT. Pt reported no complaints, and stated that the stimulation was tolerable. Upon completion of mapping, pt completed a few treatment intervals for observation of side effects. Pt tolerated tx well. Pt departed from clinic without issue.

## 2019-07-29 ENCOUNTER — Other Ambulatory Visit (HOSPITAL_COMMUNITY): Payer: No Typology Code available for payment source | Attending: Psychiatry | Admitting: Emergency Medicine

## 2019-07-29 ENCOUNTER — Other Ambulatory Visit: Payer: Self-pay

## 2019-07-29 DIAGNOSIS — F332 Major depressive disorder, recurrent severe without psychotic features: Secondary | ICD-10-CM

## 2019-07-29 NOTE — Progress Notes (Signed)
Patient reported to [facility] for Repetitive Transcranial Magnetic Stimulation treatment for severe episode of recurrent major depressive disorder, without psychotic features. Patient presented with appropriate affect, level mood and denied any suicidal or homicidal ideations. Patient denies any other current symptoms and remains optimistic with continued TMS treatment. Patient reported no change in alcohol/substance use, caffeine consumption, sleep pattern or metal implant status since previous tx. Patient shared that she did not experience a headache after first day treatment.She took a pain relieve medication before tx. She watched TV.Pt watched TV.Powerwas titrated to120% for the duration of txand will remain there until patient gets adjusted. Patient reported no complaints or discomfort. Patientdeparted post-treatment with no concerns or complaints.

## 2019-07-30 ENCOUNTER — Other Ambulatory Visit: Payer: Self-pay

## 2019-07-30 ENCOUNTER — Other Ambulatory Visit (HOSPITAL_COMMUNITY): Payer: No Typology Code available for payment source | Admitting: Emergency Medicine

## 2019-07-30 DIAGNOSIS — F332 Major depressive disorder, recurrent severe without psychotic features: Secondary | ICD-10-CM

## 2019-07-30 NOTE — Progress Notes (Signed)
Patient reported to [facility] for Repetitive Transcranial Magnetic Stimulation treatment for severe episode of recurrent major depressive disorder, without psychotic features. Patient presented with appropriate affect, level mood and denied any suicidal or homicidal ideations. Patient denies any other current symptoms and remains optimistic with continued TMS treatment. Patient reported no change in alcohol/substance use, caffeine consumption, sleep pattern or metal implant status since previous tx. Patient shared that she did not experience a headache after first day treatment.She took a pain relieve medication before tx. She watched TV.Pt watched TV.Powerwas titrated to120% for the duration of txand will remain there until patient gets adjusted. Patient reported no complaints or discomfort. Patientdeparted post-treatment with no concerns or complaints. 

## 2019-07-31 ENCOUNTER — Other Ambulatory Visit (INDEPENDENT_AMBULATORY_CARE_PROVIDER_SITE_OTHER): Payer: No Typology Code available for payment source | Admitting: Emergency Medicine

## 2019-07-31 ENCOUNTER — Other Ambulatory Visit: Payer: Self-pay

## 2019-07-31 DIAGNOSIS — F332 Major depressive disorder, recurrent severe without psychotic features: Secondary | ICD-10-CM | POA: Diagnosis not present

## 2019-07-31 NOTE — Progress Notes (Signed)
Patient reported to [facility] for Repetitive Transcranial Magnetic Stimulation treatment for severe episode of recurrent major depressive disorder, without psychotic features. Patient presented with appropriate affect, level mood and denied any suicidal or homicidal ideations. Patient denies any other current symptoms and remains optimistic with continued TMS treatment. Patient reported no change in alcohol/substance use, caffeine consumption, sleep pattern or metal implant status since previous tx. Shewatched TV.Pt watched TV.Powerwas titrated to120% for the duration of txand will remain there until patient gets adjusted. Patient reported no complaints or discomfort. Patientdeparted post-treatment with no concerns or complaints. 

## 2019-08-03 ENCOUNTER — Other Ambulatory Visit: Payer: Self-pay

## 2019-08-03 ENCOUNTER — Ambulatory Visit (INDEPENDENT_AMBULATORY_CARE_PROVIDER_SITE_OTHER): Payer: No Typology Code available for payment source | Admitting: Family Medicine

## 2019-08-03 ENCOUNTER — Other Ambulatory Visit (HOSPITAL_COMMUNITY): Payer: No Typology Code available for payment source | Attending: Psychiatry | Admitting: Emergency Medicine

## 2019-08-03 ENCOUNTER — Encounter: Payer: Self-pay | Admitting: Family Medicine

## 2019-08-03 VITALS — BP 98/65 | HR 110 | Temp 99.7°F | Wt 112.0 lb

## 2019-08-03 DIAGNOSIS — F332 Major depressive disorder, recurrent severe without psychotic features: Secondary | ICD-10-CM

## 2019-08-03 DIAGNOSIS — R238 Other skin changes: Secondary | ICD-10-CM

## 2019-08-03 DIAGNOSIS — T148XXA Other injury of unspecified body region, initial encounter: Secondary | ICD-10-CM

## 2019-08-03 DIAGNOSIS — R682 Dry mouth, unspecified: Secondary | ICD-10-CM

## 2019-08-03 DIAGNOSIS — R233 Spontaneous ecchymoses: Secondary | ICD-10-CM

## 2019-08-03 DIAGNOSIS — E559 Vitamin D deficiency, unspecified: Secondary | ICD-10-CM | POA: Diagnosis not present

## 2019-08-03 MED ORDER — VITAMIN D (ERGOCALCIFEROL) 1.25 MG (50000 UNIT) PO CAPS: 50000.0000 [IU] | ORAL_CAPSULE | ORAL | 11 refills | Status: DC

## 2019-08-03 NOTE — Progress Notes (Signed)
   SUBJECTIVE:   CHIEF COMPLAINT / HPI:   Low vitamin D: Patient with hx of vitamin D deficiency. Currently having treatment for psych and she has concerns that if her level is low she may not be having proper treatment and it may not work as well. She was previously on 50,000 units weekly and is wanting to go back on that medication  Bruising: Patient reports finding bruising on her legs for about 5 years without any known trauma. She does not remember hitting it and currently has a few bruises along her shins in multiple stages of healing. She denies any bleeding, rashes. She reports being told in the past  That she likely hit herself.   Dry Mouth: Patient reports having dry mouth when she sleeps. It has been going on for quite some time. She denies any new medication changes. She reports no fevers, chills. She only has this happen at night when going to bed. Reports it improved while visiting a cousin who had a humidifier int heir bedroom.   PERTINENT  PMH / PSH: anxiety and depression  OBJECTIVE:  BP 98/65   Pulse (!) 110   Temp 99.7 F (37.6 C) (Oral)   Wt 112 lb (50.8 kg)   LMP 07/06/2019   SpO2 98%   BMI 21.87 kg/m   General: NAD, pleasant Neck: Supple Respiratory:  normal work of breathing  ASSESSMENT/PLAN:   Vitamin D insufficiency Low vitamin D and patient concerned it may be contributing to her depression. Reassured patient that there is little evidence to show it has an effect but can treat with vit D 50,000 unit weekly. Encouraged her to get outside and get in the sunlight.   Dry mouth Only at night. Patient asking about biotene. She may try this but think she would benefit from humidifier in bedroom given hx. Not likely medication side effect but if it continues, patient to return   Bruising Patient with reported 5 years of bruising. Low risk overall given hx and no epistaxis or bleeding gums. Normal platelets and PT/INR. Patient requesting oncology workup given  continued bruising. May discuss referral at follow up.     [place] Cimone Fahey, DO PGY-3, [facility]

## 2019-08-03 NOTE — Progress Notes (Signed)
Patient reported to [facility] for Repetitive Transcranial Magnetic Stimulation treatment for severe episode of recurrent major depressive disorder, without psychotic features. Patient presented with appropriate affect, level mood and denied any suicidal or homicidal ideations. Patient denies any other current symptoms and remains optimistic with continued TMS treatment. Patient reported no change in alcohol/substance use, caffeine consumption, sleep pattern or metal implant status since previous tx. Shewatched TV.Pt watched TV.Powerwas titrated to120% for the duration of txand will remain there until patient gets adjusted. Patient reported no complaints or discomfort. Patientdeparted post-treatment with no concerns or complaints. 

## 2019-08-03 NOTE — Patient Instructions (Signed)
Thank you for coming to see me today. It was a pleasure! Today we talked about:   We will get some blood work today to see if your bruising is from clotting.  We will release these results on my chart and call you if anything is abnormal.  For your vitamin D deficiency we will start with 50,000 units weekly.  I also recommend that you try to get out into the sun.    For your dry mouth I recommend that you get a humidifier to place in your bedroom at night while you are sleeping.  You may also try Biotene swishes but I think the humidifier would be the best first step.  For the area on your feet I recommend that you get corn pads in order to offload the pressure from the spots on your toes.  Please follow-up as needed.  If you have any questions or concerns, please do not hesitate to call the office at [phone].  Take Care,   [place] Halsey Persaud, DO  Preventing Vitamin D Deficiency Vitamin D is a nutrient that helps your body absorb calcium from food. It plays a key role in the health of bones and teeth, muscle function, and infection prevention. Our bodies make vitamin D when our skin is exposed to direct sunlight. However, for many people, this may not be enough vitamin D to meet the body's needs. When you get too little vitamin D, it is called a deficiency. How can this condition affect me? A vitamin D deficiency can put you at risk of developing conditions that cause bones to be brittle, such as rickets or osteoporosis. If you are over age 74, not having enough vitamin D may weaken your muscles and bones and increase your risk for falls and broken bones. What can increase my risk? You may be at risk for a vitamin D deficiency if you:  Are pregnant.  Are obese.  Are over 10 years old.  Have dark skin.  Take certain medicines that affect the way vitamin D is absorbed.  Have had gastric bypass surgery. Other risk factors include:  Having a condition that limits your ability to  absorb fat, such as cystic fibrosis, celiac disease, or inflammatory bowel disease.  Having certain inherited conditions.  Not having access to foods rich in vitamin D.  Having limited ability to move.  Living in areas that have fewer hours of sunlight.  Spending most of your day indoors, or you cover your skin all the time when you are outdoors. Breastfed infants are also at risk for vitamin D deficiency. What actions can I take to reduce my risk of a vitamin D deficiency? Knowing the best sources of vitamin D You can meet your daily vitamin D needs from:  Foods.  Dietary supplements.  Direct exposure to natural sunlight.  Infant formula (for babies). Knowing how much vitamin D you need General recommendations for daily vitamin D intake vary by these categories:  Infants: 400 International Units.  Children over 67 year old: 600 International Units.  Adults: 600 International Units.  Pregnant and breastfeeding women: 600 International Units.  Adults over 53 years old: 80 International Units. These are minimum levels of recommended amounts. Your health care provider may recommend a different amount of vitamin D intake based on your specific needs and your overall health. Getting sun exposure  Get regular, safe exposure to natural sunlight. Expose your skin to direct sunlight for at least 15 minutes every day. If you have  dark skin, you may need to expose your skin for a longer period of time.  Protect your skin from too much sun exposure. This helps to prevent skin cancer.  Ask your health care provider if regular sun exposure is safe for you.  Do not use a tanning bed. Eating and drinking   Eat foods that naturally contain vitamin D. These include: ? Beef liver. ? Egg yolk. ? Fatty fish, such as cod, salmon, trout, swordfish, shrimp, sardines, and tuna. ? Cheese. ? Mushrooms. ? Oysters.  Eat or drink products that have been fortified with vitamin D. Fortified  means that vitamin D has been added to the food. These may include: ? Cereals. ? Dairy products, such as milk, yogurt, butter, or margarine. ? Orange juice. ? Alternative milks, such as soy milk or almond milk.  When choosing foods, check the food label on the package to see: ? How much vitamin D is in the item. ? If the food is fortified with vitamin D.  Although it is hard to get your vitamin D requirement from foods alone, you should eat a balanced diet each day that includes foods naturally higher in vitamin D or fortified with it. Try to include the following in your diet each day: ? 2-3 servings of meat or meat alternatives. ? 2-3 servings of dairy. Taking supplements If you are at risk for vitamin D deficiency, or if you have certain diseases, your health care provider may recommend that you take a vitamin D supplement. Make sure you:  Talk with your health care provider before you start taking any vitamin D supplements. You may be more sensitive to the side effects of vitamin D supplements if you are on certain medicines or have certain medical conditions.  Tell your health care provider about all medicines you are taking, including vitamin, mineral, and herbal supplements.  Take medicines and supplements only as told by your health care provider. Summary  Vitamin D is a nutrient that helps your body absorb calcium from food.  A vitamin D deficiency can put you at risk of developing conditions that cause bones to be brittle, such as rickets or osteoporosis.  Our bodies make vitamin D when our skin is exposed to direct sunlight. However, for many people, this may not be enough vitamin D to meet the body's needs.  Some foods naturally contain vitamin D, including beef liver, egg yolk, and fatty fish.  Products may also be fortified with vitamin D. Fortified means that vitamin D has been added to the food. This information is not intended to replace advice given to you by your  health care provider. Make sure you discuss any questions you have with your health care provider. Document Revised: 12/10/2018 Document Reviewed: 03/14/2018 Elsevier Patient Education  2020 [employer].

## 2019-08-04 ENCOUNTER — Other Ambulatory Visit (HOSPITAL_COMMUNITY): Payer: No Typology Code available for payment source | Admitting: Emergency Medicine

## 2019-08-04 ENCOUNTER — Encounter (HOSPITAL_COMMUNITY): Payer: No Typology Code available for payment source

## 2019-08-04 DIAGNOSIS — F332 Major depressive disorder, recurrent severe without psychotic features: Secondary | ICD-10-CM

## 2019-08-04 LAB — CBC
Hematocrit: 38 % (ref 34.0–46.6)
Hemoglobin: 12.4 g/dL (ref 11.1–15.9)
MCH: 29.4 pg (ref 26.6–33.0)
MCHC: 32.6 g/dL (ref 31.5–35.7)
MCV: 90 fL (ref 79–97)
Platelets: 229 10*3/uL (ref 150–450)
RBC: 4.22 x10E6/uL (ref 3.77–5.28)
RDW: 13.6 % (ref 11.7–15.4)
WBC: 5.7 10*3/uL (ref 3.4–10.8)

## 2019-08-04 LAB — PROTIME-INR
INR: 1 (ref 0.9–1.2)
Prothrombin Time: 10.5 s (ref 9.1–12.0)

## 2019-08-04 NOTE — Progress Notes (Signed)
Patient reported to [facility] for Repetitive Transcranial Magnetic Stimulation treatment for severe episode of recurrent major depressive disorder, without psychotic features. Patient presented with appropriate affect, level mood and denied any suicidal or homicidal ideations. Patient denies any other current symptoms and remains optimistic with continued TMS treatment. Patient reported no change in alcohol/substance use, caffeine consumption, sleep pattern or metal implant status since previous tx. Shewatched TV.Pt watched TV.Pt shared that she created a vision board with her short and long term goals. Powerwas titrated to120% for the duration of txand will remain there until patient gets adjusted. Patient reported no complaints or discomfort. Patientdeparted post-treatment with no concerns or complaints.

## 2019-08-05 ENCOUNTER — Other Ambulatory Visit: Payer: Self-pay

## 2019-08-05 ENCOUNTER — Other Ambulatory Visit (INDEPENDENT_AMBULATORY_CARE_PROVIDER_SITE_OTHER): Payer: No Typology Code available for payment source

## 2019-08-05 ENCOUNTER — Encounter (HOSPITAL_COMMUNITY): Payer: No Typology Code available for payment source

## 2019-08-05 DIAGNOSIS — F332 Major depressive disorder, recurrent severe without psychotic features: Secondary | ICD-10-CM | POA: Diagnosis not present

## 2019-08-05 NOTE — Progress Notes (Signed)
Patient reported to [facility] for Repetitive Transcranial Magnetic Stimulation treatment for severe episode of recurrent major depressive disorder, without psychotic features. Patient presented with appropriate affect, level mood and denied any suicidal or homicidal ideations. Patient denies any other current symptoms and remains optimistic with continued TMS treatment. Patient reported no change in alcohol/substance use, caffeine consumption, sleep pattern or metal implant status since previous tx. Shewatched TV.Pt watched TV.Pt shared that she created a vision board with her short and long term goals. Powerwas titrated to120% for the duration of txand will remain there until patient gets adjusted. Patient reported no complaints or discomfort. Patientdeparted post-treatment with no concerns or complaints. Pt discussed plans to go to graduate school and moving. Pt has been working on creating a Scientist, research (physical sciences).

## 2019-08-06 ENCOUNTER — Encounter (HOSPITAL_COMMUNITY): Payer: No Typology Code available for payment source

## 2019-08-07 ENCOUNTER — Other Ambulatory Visit (INDEPENDENT_AMBULATORY_CARE_PROVIDER_SITE_OTHER): Payer: No Typology Code available for payment source | Admitting: Emergency Medicine

## 2019-08-07 ENCOUNTER — Other Ambulatory Visit: Payer: Self-pay

## 2019-08-07 DIAGNOSIS — F332 Major depressive disorder, recurrent severe without psychotic features: Secondary | ICD-10-CM

## 2019-08-07 DIAGNOSIS — T148XXA Other injury of unspecified body region, initial encounter: Secondary | ICD-10-CM | POA: Insufficient documentation

## 2019-08-07 DIAGNOSIS — R682 Dry mouth, unspecified: Secondary | ICD-10-CM | POA: Insufficient documentation

## 2019-08-07 NOTE — Assessment & Plan Note (Signed)
Patient with reported 5 years of bruising. Low risk overall given hx and no epistaxis or bleeding gums. Normal platelets and PT/INR. Patient requesting oncology workup given continued bruising. May discuss referral at follow up.

## 2019-08-07 NOTE — Assessment & Plan Note (Signed)
Only at night. Patient asking about biotene. She may try this but think she would benefit from humidifier in bedroom given hx. Not likely medication side effect but if it continues, patient to return

## 2019-08-07 NOTE — Assessment & Plan Note (Signed)
Low vitamin D and patient concerned it may be contributing to her depression. Reassured patient that there is little evidence to show it has an effect but can treat with vit D 50,000 unit weekly. Encouraged her to get outside and get in the sunlight.

## 2019-08-07 NOTE — Progress Notes (Signed)
Patient reported to [facility] for Repetitive Transcranial Magnetic Stimulation treatment for severe episode of recurrent major depressive disorder, without psychotic features. Patient presented with appropriate affect, level mood and denied any suicidal or homicidal ideations. Patient denies any other current symptoms and remains optimistic with continued TMS treatment. Patient reported no change in alcohol/substance use, caffeine consumption, sleep pattern or metal implant status since previous tx. Shewatched TV.Pt watched TV.Powerwas titrated to120% for the duration of txand will remain there until patient gets adjusted. Patient reported no complaints or discomfort. Patientdeparted post-treatment with no concerns or complaints. 

## 2019-08-10 ENCOUNTER — Other Ambulatory Visit: Payer: Self-pay

## 2019-08-10 ENCOUNTER — Other Ambulatory Visit (INDEPENDENT_AMBULATORY_CARE_PROVIDER_SITE_OTHER): Payer: No Typology Code available for payment source

## 2019-08-10 DIAGNOSIS — F332 Major depressive disorder, recurrent severe without psychotic features: Secondary | ICD-10-CM | POA: Diagnosis not present

## 2019-08-10 NOTE — Progress Notes (Signed)
Patient reported to [facility] for Repetitive Transcranial Magnetic Stimulation treatment for severe episode of recurrent major depressive disorder, without psychotic features. Patient presented with appropriate affect, level mood and denied any suicidal or homicidal ideations. Patient denies any other current symptoms and remains optimistic with continued TMS treatment. Patient reported no change in alcohol/substance use, caffeine consumption, sleep pattern or metal implant status since previous tx. Shewatched TV.Pt watched TV.Powerwas titrated to120% for the duration of txand will remain there until patient gets adjusted. Patient reported no complaints or discomfort. Patientdeparted post-treatment with no concerns or complaints. Pt stated during treatment that she was feeling anxious after a previously missed session. Pt does not want to have to prolong the treatment as she is planning on moving for graduate school.

## 2019-08-11 ENCOUNTER — Other Ambulatory Visit (INDEPENDENT_AMBULATORY_CARE_PROVIDER_SITE_OTHER): Payer: No Typology Code available for payment source

## 2019-08-11 ENCOUNTER — Other Ambulatory Visit: Payer: Self-pay

## 2019-08-11 DIAGNOSIS — F332 Major depressive disorder, recurrent severe without psychotic features: Secondary | ICD-10-CM | POA: Diagnosis not present

## 2019-08-11 NOTE — Progress Notes (Signed)
Patient reported to [facility] for Repetitive Transcranial Magnetic Stimulation treatment for severe episode of recurrent major depressive disorder, without psychotic features. Patient presented with appropriate affect, level mood and denied any suicidal or homicidal ideations. Patient denies any other current symptoms and remains optimistic with continued TMS treatment. Patient reported no change in alcohol/substance use, caffeine consumption, sleep pattern or metal implant status since previous tx. Shewatched TV.Pt watched TV.Powerwas titrated to120% for the duration of txand will remain there until patient gets adjusted. Patient reported no complaints or discomfort. Patientdeparted post-treatment with no concerns or complaints. 

## 2019-08-12 ENCOUNTER — Other Ambulatory Visit (INDEPENDENT_AMBULATORY_CARE_PROVIDER_SITE_OTHER): Payer: No Typology Code available for payment source

## 2019-08-12 ENCOUNTER — Other Ambulatory Visit: Payer: Self-pay

## 2019-08-12 DIAGNOSIS — F332 Major depressive disorder, recurrent severe without psychotic features: Secondary | ICD-10-CM

## 2019-08-12 NOTE — Progress Notes (Signed)
Patient reported to [facility] for Repetitive Transcranial Magnetic Stimulation treatment for severe episode of recurrent major depressive disorder, without psychotic features. Patient presented with appropriate affect, level mood and denied any suicidal or homicidal ideations. Patient denies any other current symptoms and remains optimistic with continued TMS treatment. Patient reported no change in alcohol/substance use, caffeine consumption, sleep pattern or metal implant status since previous tx. Shewatched TV.Pt watched TV.Powerwas titrated to120% for the duration of txand will remain there until patient gets adjusted. Patient reported no complaints or discomfort. Patientdeparted post-treatment with no concerns or complaints. Pt stated that while doing some research she read that some pt have received two session in one day, and wanted to know if that was something that she may be able to do in order to complete her treatment early. Pt was notified that this writer would have to consult with the MD to provide an accurate answer to her question.

## 2019-08-13 ENCOUNTER — Other Ambulatory Visit: Payer: Self-pay

## 2019-08-13 ENCOUNTER — Other Ambulatory Visit (INDEPENDENT_AMBULATORY_CARE_PROVIDER_SITE_OTHER): Payer: No Typology Code available for payment source

## 2019-08-13 DIAGNOSIS — F332 Major depressive disorder, recurrent severe without psychotic features: Secondary | ICD-10-CM | POA: Diagnosis not present

## 2019-08-13 NOTE — Progress Notes (Signed)
Patient reported to [facility] for Repetitive Transcranial Magnetic Stimulation treatment for severe episode of recurrent major depressive disorder, without psychotic features. Patient presented with appropriate affect, level mood and denied any suicidal or homicidal ideations. Patient denies any other current symptoms and remains optimistic with continued TMS treatment. Patient reported no change in alcohol/substance use, caffeine consumption, sleep pattern or metal implant status since previous tx. Shewatched TV.Pt watched TV.Powerwas titrated to120% for the duration of txand will remain there until patient gets adjusted. Patient reported no complaints or discomfort. Patientdeparted post-treatment with no concerns or complaints. 

## 2019-08-14 ENCOUNTER — Other Ambulatory Visit (INDEPENDENT_AMBULATORY_CARE_PROVIDER_SITE_OTHER): Payer: No Typology Code available for payment source

## 2019-08-14 ENCOUNTER — Other Ambulatory Visit: Payer: Self-pay

## 2019-08-14 DIAGNOSIS — F332 Major depressive disorder, recurrent severe without psychotic features: Secondary | ICD-10-CM | POA: Diagnosis not present

## 2019-08-14 NOTE — Progress Notes (Signed)
Patient reported to [facility] for Repetitive Transcranial Magnetic Stimulation treatment for severe episode of recurrent major depressive disorder, without psychotic features. Patient presented with appropriate affect, level mood and denied any suicidal or homicidal ideations. Patient denies any other current symptoms and remains optimistic with continued TMS treatment. Patient reported no change in alcohol/substance use, caffeine consumption, sleep pattern or metal implant status since previous tx. Shewatched TV.Pt watched TV.Powerwas titrated to120% for the duration of txand will remain there until patient gets adjusted. Patient reported no complaints or discomfort. Patientdeparted post-treatment with no concerns or complaints. 

## 2019-08-17 ENCOUNTER — Other Ambulatory Visit: Payer: Self-pay

## 2019-08-17 ENCOUNTER — Encounter: Payer: Self-pay | Admitting: Family Medicine

## 2019-08-17 ENCOUNTER — Other Ambulatory Visit (INDEPENDENT_AMBULATORY_CARE_PROVIDER_SITE_OTHER): Payer: No Typology Code available for payment source

## 2019-08-17 DIAGNOSIS — F332 Major depressive disorder, recurrent severe without psychotic features: Secondary | ICD-10-CM

## 2019-08-17 NOTE — Progress Notes (Signed)
Patient reported to [facility] for Repetitive Transcranial Magnetic Stimulation treatment for severe episode of recurrent major depressive disorder, without psychotic features. Patient presented with appropriate affect, level mood and denied any suicidal or homicidal ideations. Patient denies any other current symptoms and remains optimistic with continued TMS treatment. Patient reported no change in alcohol/substance use, caffeine consumption, sleep pattern or metal implant status since previous tx. Shewatched TV.Pt watched TV.Powerwas titrated to120% for the duration of txand will remain there until patient gets adjusted. Patient reported no complaints or discomfort. Patientdeparted post-treatment with no concerns or complaints. 

## 2019-08-18 ENCOUNTER — Other Ambulatory Visit (INDEPENDENT_AMBULATORY_CARE_PROVIDER_SITE_OTHER): Payer: No Typology Code available for payment source

## 2019-08-18 ENCOUNTER — Other Ambulatory Visit: Payer: Self-pay

## 2019-08-18 DIAGNOSIS — F332 Major depressive disorder, recurrent severe without psychotic features: Secondary | ICD-10-CM | POA: Diagnosis not present

## 2019-08-18 NOTE — Progress Notes (Signed)
Patient reported to [facility] for Repetitive Transcranial Magnetic Stimulation treatment for severe episode of recurrent major depressive disorder, without psychotic features. Patient presented with appropriate affect, level mood and denied any suicidal or homicidal ideations. Patient denies any other current symptoms and remains optimistic with continued TMS treatment. Patient reported no change in alcohol/substance use, caffeine consumption, sleep pattern or metal implant status since previous tx. Shewatched TV.Pt watched TV.Powerwas titrated to120% for the duration of txand will remain there until patient gets adjusted. Patient reported no complaints or discomfort. Pt reports increased anxiety about the treatment. Pt states that she feels that the treatment is not working, because she is about half way through and still feeling the same. Pt reports increased levels of stress as she if worried about her her transition into graduate school and her move to [place]. Pt reports that someone she knows past away this week and she is having difficulty deciding if she should go to the funeral.

## 2019-08-19 ENCOUNTER — Other Ambulatory Visit (INDEPENDENT_AMBULATORY_CARE_PROVIDER_SITE_OTHER): Payer: No Typology Code available for payment source

## 2019-08-19 ENCOUNTER — Other Ambulatory Visit: Payer: Self-pay

## 2019-08-19 DIAGNOSIS — F332 Major depressive disorder, recurrent severe without psychotic features: Secondary | ICD-10-CM | POA: Diagnosis not present

## 2019-08-19 NOTE — Progress Notes (Signed)
Patient reported to [facility] for Repetitive Transcranial Magnetic Stimulation treatment for severe episode of recurrent major depressive disorder, without psychotic features. Patient presented with appropriate affect, level mood and denied any suicidal or homicidal ideations. Patient denies any other current symptoms and remains optimistic with continued TMS treatment. Patient reported no change in alcohol/substance use, caffeine consumption, sleep pattern or metal implant status since previous tx. Shewatched TV.Pt watched TV.Powerwas titrated to120% for the duration of txand will remain there until patient gets adjusted. Patient reported no complaints or discomfort. Patientdeparted post-treatment with no concerns or complaints. 

## 2019-08-20 ENCOUNTER — Other Ambulatory Visit: Payer: Self-pay

## 2019-08-20 ENCOUNTER — Other Ambulatory Visit (INDEPENDENT_AMBULATORY_CARE_PROVIDER_SITE_OTHER): Payer: No Typology Code available for payment source

## 2019-08-20 DIAGNOSIS — F332 Major depressive disorder, recurrent severe without psychotic features: Secondary | ICD-10-CM

## 2019-08-20 NOTE — Progress Notes (Signed)
Patient reported to [facility] for Repetitive Transcranial Magnetic Stimulation treatment for severe episode of recurrent major depressive disorder, without psychotic features. Patient presented with appropriate affect, level mood and denied any suicidal or homicidal ideations. Patient denies any other current symptoms and remains optimistic with continued TMS treatment. Patient reported no change in alcohol/substance use, caffeine consumption, sleep pattern or metal implant status since previous tx. Shewatched TV.Pt watched TV.Powerwas titrated to120% for the duration of txand will remain there until patient gets adjusted. Patient reported no complaints or discomfort. Patientdeparted post-treatment with no concerns or complaints. 

## 2019-08-21 ENCOUNTER — Other Ambulatory Visit (INDEPENDENT_AMBULATORY_CARE_PROVIDER_SITE_OTHER): Payer: No Typology Code available for payment source

## 2019-08-21 ENCOUNTER — Other Ambulatory Visit: Payer: Self-pay

## 2019-08-21 DIAGNOSIS — F332 Major depressive disorder, recurrent severe without psychotic features: Secondary | ICD-10-CM

## 2019-08-21 NOTE — Progress Notes (Signed)
Patient reported to [facility] for Repetitive Transcranial Magnetic Stimulation treatment for severe episode of recurrent major depressive disorder, without psychotic features. Patient presented with appropriate affect, level mood and denied any suicidal or homicidal ideations. Patient denies any other current symptoms and remains optimistic with continued TMS treatment. Patient reported no change in alcohol/substance use, caffeine consumption, sleep pattern or metal implant status since previous tx. Shewatched TV.Pt watched TV.Powerwas titrated to120% for the duration of txand will remain there until patient gets adjusted. Patient reported no complaints or discomfort. Patientdeparted post-treatment with no concerns or complaints. Pt scored an 8 on the PHQ-9 prior to today's treatment session.

## 2019-08-24 ENCOUNTER — Other Ambulatory Visit (INDEPENDENT_AMBULATORY_CARE_PROVIDER_SITE_OTHER): Payer: No Typology Code available for payment source

## 2019-08-24 ENCOUNTER — Other Ambulatory Visit: Payer: Self-pay

## 2019-08-24 DIAGNOSIS — F332 Major depressive disorder, recurrent severe without psychotic features: Secondary | ICD-10-CM

## 2019-08-24 NOTE — Progress Notes (Signed)
Patient reported to [facility] for Repetitive Transcranial Magnetic Stimulation treatment for severe episode of recurrent major depressive disorder, without psychotic features. Patient presented with appropriate affect, level mood and denied any suicidal or homicidal ideations. Patient denies any other current symptoms and remains optimistic with continued TMS treatment. Patient reported no change in alcohol/substance use, caffeine consumption, sleep pattern or metal implant status since previous tx. Shewatched TV.Pt watched TV.Powerwas titrated to120% for the duration of txand will remain there until patient gets adjusted. Patient reported no complaints or discomfort. Patientdeparted post-treatment with no concerns or complaints. 

## 2019-08-25 ENCOUNTER — Telehealth: Payer: Self-pay

## 2019-08-25 ENCOUNTER — Other Ambulatory Visit (INDEPENDENT_AMBULATORY_CARE_PROVIDER_SITE_OTHER): Payer: No Typology Code available for payment source

## 2019-08-25 ENCOUNTER — Other Ambulatory Visit: Payer: Self-pay

## 2019-08-25 DIAGNOSIS — R233 Spontaneous ecchymoses: Secondary | ICD-10-CM

## 2019-08-25 DIAGNOSIS — F332 Major depressive disorder, recurrent severe without psychotic features: Secondary | ICD-10-CM

## 2019-08-25 DIAGNOSIS — R238 Other skin changes: Secondary | ICD-10-CM

## 2019-08-25 NOTE — Telephone Encounter (Signed)
Patient calls nurse line regarding recent lab results on 08/03/19. Reviewed lab results with patient, however, patient states that she is continuing to have issues with bruising. Patient states that during office visit with Dr. Talbert Forest, they had discussed potentially placing referral to hematology for further investigation of bruising.   To PCP and Dr. Talbert Forest  Please advise  Veronda Prude, RN

## 2019-08-25 NOTE — Telephone Encounter (Signed)
Referral placed.

## 2019-08-25 NOTE — Progress Notes (Signed)
Patient reported to [facility] for Repetitive Transcranial Magnetic Stimulation treatment for severe episode of recurrent major depressive disorder, without psychotic features. Patient presented with appropriate affect, level mood and denied any suicidal or homicidal ideations. Patient denies any other current symptoms and remains optimistic with continued TMS treatment. Patient reported no change in alcohol/substance use, caffeine consumption, sleep pattern or metal implant status since previous tx. Shewatched TV.Pt watched TV.Powerwas titrated to120% for the duration of txand will remain there until patient gets adjusted. Patient reported no complaints or discomfort. Patientdeparted post-treatment with no concerns or complaints. 

## 2019-08-26 ENCOUNTER — Other Ambulatory Visit: Payer: Self-pay

## 2019-08-26 ENCOUNTER — Other Ambulatory Visit (INDEPENDENT_AMBULATORY_CARE_PROVIDER_SITE_OTHER): Payer: No Typology Code available for payment source

## 2019-08-26 DIAGNOSIS — F332 Major depressive disorder, recurrent severe without psychotic features: Secondary | ICD-10-CM

## 2019-08-26 NOTE — Progress Notes (Signed)
Patient reported to [facility] for Repetitive Transcranial Magnetic Stimulation treatment for severe episode of recurrent major depressive disorder, without psychotic features. Patient presented with appropriate affect, level mood and denied any suicidal or homicidal ideations. Patient denies any other current symptoms and remains optimistic with continued TMS treatment. Patient reported no change in alcohol/substance use, caffeine consumption, sleep pattern or metal implant status since previous tx. Shewatched TV.Pt watched TV.Powerwas titrated to120% for the duration of txand will remain there until patient gets adjusted. Patient reported no complaints or discomfort. Patientdeparted post-treatment with no concerns or complaints. 

## 2019-08-27 ENCOUNTER — Other Ambulatory Visit (INDEPENDENT_AMBULATORY_CARE_PROVIDER_SITE_OTHER): Payer: No Typology Code available for payment source

## 2019-08-27 ENCOUNTER — Other Ambulatory Visit: Payer: Self-pay

## 2019-08-27 DIAGNOSIS — F332 Major depressive disorder, recurrent severe without psychotic features: Secondary | ICD-10-CM | POA: Diagnosis not present

## 2019-08-27 NOTE — Progress Notes (Signed)
Patient reported to [facility] for Repetitive Transcranial Magnetic Stimulation treatment for severe episode of recurrent major depressive disorder, without psychotic features. Patient presented with appropriate affect, level mood and denied any suicidal or homicidal ideations. Patient denies any other current symptoms and remains optimistic with continued TMS treatment. Patient reported no change in alcohol/substance use, caffeine consumption, sleep pattern or metal implant status since previous tx. Shewatched TV.Pt watched TV.Powerwas titrated to120% for the duration of txand will remain there until patient gets adjusted. Patient reported no complaints or discomfort. Patientdeparted post-treatment with no concerns or complaints. 

## 2019-08-28 ENCOUNTER — Other Ambulatory Visit (INDEPENDENT_AMBULATORY_CARE_PROVIDER_SITE_OTHER): Payer: No Typology Code available for payment source

## 2019-08-28 ENCOUNTER — Other Ambulatory Visit: Payer: Self-pay

## 2019-08-28 DIAGNOSIS — F332 Major depressive disorder, recurrent severe without psychotic features: Secondary | ICD-10-CM | POA: Diagnosis not present

## 2019-08-28 NOTE — Progress Notes (Signed)
Patient reported to [facility] for Repetitive Transcranial Magnetic Stimulation treatment for severe episode of recurrent major depressive disorder, without psychotic features. Patient presented with appropriate affect, level mood and denied any suicidal or homicidal ideations. Patient denies any other current symptoms and remains optimistic with continued TMS treatment. Patient reported no change in alcohol/substance use, caffeine consumption, sleep pattern or metal implant status since previous tx. Shewatched TV.Pt watched TV.Powerwas titrated to120% for the duration of txand will remain there until patient gets adjusted. Patient reported no complaints or discomfort. Patientdeparted post-treatment with no concerns or complaints.Pt scored an 7 on the PHQ-9 prior to today's treatment session.

## 2019-09-01 ENCOUNTER — Other Ambulatory Visit: Payer: Self-pay

## 2019-09-01 ENCOUNTER — Other Ambulatory Visit (HOSPITAL_COMMUNITY): Payer: No Typology Code available for payment source | Attending: Psychiatry

## 2019-09-01 DIAGNOSIS — F332 Major depressive disorder, recurrent severe without psychotic features: Secondary | ICD-10-CM

## 2019-09-01 NOTE — Progress Notes (Signed)
Patient reported to [facility] for Repetitive Transcranial Magnetic Stimulation treatment for severe episode of recurrent major depressive disorder, without psychotic features. Patient presented with appropriate affect, level mood and denied any suicidal or homicidal ideations. Patient denies any other current symptoms and remains optimistic with continued TMS treatment. Patient reported no change in alcohol/substance use, caffeine consumption, sleep pattern or metal implant status since previous tx. Shewatched TV.Pt watched TV.Powerwas titrated to120% for the duration of txand will remain there until patient gets adjusted. Patient reported no complaints or discomfort. Patientdeparted post-treatment with no concerns or complaints.Pt scored an 7 on the PHQ-9 prior to today's treatment session.  

## 2019-09-02 ENCOUNTER — Other Ambulatory Visit (INDEPENDENT_AMBULATORY_CARE_PROVIDER_SITE_OTHER): Payer: No Typology Code available for payment source

## 2019-09-02 ENCOUNTER — Telehealth: Payer: Self-pay | Admitting: Hematology and Oncology

## 2019-09-02 ENCOUNTER — Other Ambulatory Visit: Payer: Self-pay

## 2019-09-02 DIAGNOSIS — F332 Major depressive disorder, recurrent severe without psychotic features: Secondary | ICD-10-CM | POA: Diagnosis not present

## 2019-09-02 NOTE — Telephone Encounter (Signed)
Received a new pt referral from Dr. Lum Babe for easy bruising. Ms. Bianca Forbes returned my call and has been scheduled to see Dr. Leonides Schanz on 6/11 at 1pm. Pt aware to arrive 15 minutes early.

## 2019-09-02 NOTE — Progress Notes (Signed)
Patient reported to [facility] for Repetitive Transcranial Magnetic Stimulation treatment for severe episode of recurrent major depressive disorder, without psychotic features. Patient presented with appropriate affect, level mood and denied any suicidal or homicidal ideations. Patient denies any other current symptoms and remains optimistic with continued TMS treatment. Patient reported no change in alcohol/substance use, caffeine consumption, sleep pattern or metal implant status since previous tx. Shewatched TV.Pt watched TV.Powerwas titrated to120% for the duration of txand will remain there until patient gets adjusted. Patient reported no complaints or discomfort. Patientdeparted post-treatment with no concerns or complaints. 

## 2019-09-03 ENCOUNTER — Other Ambulatory Visit: Payer: Self-pay

## 2019-09-03 ENCOUNTER — Other Ambulatory Visit (INDEPENDENT_AMBULATORY_CARE_PROVIDER_SITE_OTHER): Payer: No Typology Code available for payment source

## 2019-09-03 DIAGNOSIS — F332 Major depressive disorder, recurrent severe without psychotic features: Secondary | ICD-10-CM | POA: Diagnosis not present

## 2019-09-03 NOTE — Progress Notes (Signed)
Patient reported to [facility] for Repetitive Transcranial Magnetic Stimulation treatment for severe episode of recurrent major depressive disorder, without psychotic features. Patient presented with appropriate affect, level mood and denied any suicidal or homicidal ideations. Patient denies any other current symptoms and remains optimistic with continued TMS treatment. Patient reported no change in alcohol/substance use, caffeine consumption, sleep pattern or metal implant status since previous tx. Shewatched TV.Pt watched TV.Powerwas titrated to120% for the duration of txand will remain there until patient gets adjusted. Patient reported no complaints or discomfort. Patientdeparted post-treatment with no concerns or complaints. 

## 2019-09-04 ENCOUNTER — Other Ambulatory Visit: Payer: Self-pay

## 2019-09-04 ENCOUNTER — Other Ambulatory Visit (INDEPENDENT_AMBULATORY_CARE_PROVIDER_SITE_OTHER): Payer: No Typology Code available for payment source

## 2019-09-04 DIAGNOSIS — F332 Major depressive disorder, recurrent severe without psychotic features: Secondary | ICD-10-CM

## 2019-09-04 NOTE — Progress Notes (Signed)
Patient reported to [facility] for Repetitive Transcranial Magnetic Stimulation treatment for severe episode of recurrent major depressive disorder, without psychotic features. Patient presented with appropriate affect, level mood and denied any suicidal or homicidal ideations. Patient denies any other current symptoms and remains optimistic with continued TMS treatment. Patient reported no change in alcohol/substance use, caffeine consumption, sleep pattern or metal implant status since previous tx. Shewatched TV.Pt watched TV.Powerwas titrated to120% for the duration of txand will remain there until patient gets adjusted. Patient reported no complaints or discomfort. Patientdeparted post-treatment with no concerns or complaints.Pt completed a PHQ-9 with a score of 12.

## 2019-09-07 ENCOUNTER — Other Ambulatory Visit (INDEPENDENT_AMBULATORY_CARE_PROVIDER_SITE_OTHER): Payer: No Typology Code available for payment source

## 2019-09-07 ENCOUNTER — Other Ambulatory Visit: Payer: Self-pay

## 2019-09-07 DIAGNOSIS — F332 Major depressive disorder, recurrent severe without psychotic features: Secondary | ICD-10-CM | POA: Diagnosis not present

## 2019-09-07 NOTE — Progress Notes (Signed)
Patient reported to [facility] for Repetitive Transcranial Magnetic Stimulation treatment for severe episode of recurrent major depressive disorder, without psychotic features. Patient presented with appropriate affect, level mood and denied any suicidal or homicidal ideations. Patient denies any other current symptoms and remains optimistic with continued TMS treatment. Patient reported no change in alcohol/substance use, caffeine consumption, sleep pattern or metal implant status since previous tx. Shewatched TV.Pt watched TV.Powerwas titrated to120% for the duration of txand will remain there until patient gets adjusted. Patient reported no complaints or discomfort. Patientdeparted post-treatment with no concerns or complaints. 

## 2019-09-08 ENCOUNTER — Other Ambulatory Visit: Payer: Self-pay

## 2019-09-08 ENCOUNTER — Other Ambulatory Visit (INDEPENDENT_AMBULATORY_CARE_PROVIDER_SITE_OTHER): Payer: No Typology Code available for payment source

## 2019-09-08 DIAGNOSIS — F332 Major depressive disorder, recurrent severe without psychotic features: Secondary | ICD-10-CM | POA: Diagnosis not present

## 2019-09-08 NOTE — Progress Notes (Signed)
Patient reported to [facility] for Repetitive Transcranial Magnetic Stimulation treatment for severe episode of recurrent major depressive disorder, without psychotic features. Patient presented with appropriate affect, level mood and denied any suicidal or homicidal ideations. Patient denies any other current symptoms and remains optimistic with continued TMS treatment. Patient reported no change in alcohol/substance use, caffeine consumption, sleep pattern or metal implant status since previous tx. Shewatched TV.Pt watched TV.Powerwas titrated to120% for the duration of txand will remain there until patient gets adjusted. Patient reported no complaints or discomfort. Patientdeparted post-treatment with no concerns or complaints.Pt completed a PHQ-9 with a score of 5.

## 2019-09-09 ENCOUNTER — Other Ambulatory Visit (INDEPENDENT_AMBULATORY_CARE_PROVIDER_SITE_OTHER): Payer: No Typology Code available for payment source

## 2019-09-09 ENCOUNTER — Other Ambulatory Visit: Payer: Self-pay

## 2019-09-09 DIAGNOSIS — F332 Major depressive disorder, recurrent severe without psychotic features: Secondary | ICD-10-CM | POA: Diagnosis not present

## 2019-09-09 NOTE — Progress Notes (Signed)
Patient reported to [facility] for Repetitive Transcranial Magnetic Stimulation treatment for severe episode of recurrent major depressive disorder, without psychotic features. Patient presented with appropriate affect, level mood and denied any suicidal or homicidal ideations. Patient denies any other current symptoms and remains optimistic with continued TMS treatment. Patient reported no change in alcohol/substance use, caffeine consumption, sleep pattern or metal implant status since previous tx. Shewatched TV.Pt watched TV.Powerwas titrated to120% for the duration of txand will remain there until patient gets adjusted. Patient reported no complaints or discomfort. Patientdeparted post-treatment with no concerns or complaints. 

## 2019-09-10 ENCOUNTER — Encounter (HOSPITAL_COMMUNITY): Payer: No Typology Code available for payment source

## 2019-09-11 ENCOUNTER — Inpatient Hospital Stay: Payer: No Typology Code available for payment source

## 2019-09-11 ENCOUNTER — Other Ambulatory Visit: Payer: Self-pay

## 2019-09-11 ENCOUNTER — Inpatient Hospital Stay
Payer: No Typology Code available for payment source | Attending: Hematology and Oncology | Admitting: Hematology and Oncology

## 2019-09-11 ENCOUNTER — Encounter (HOSPITAL_COMMUNITY): Payer: No Typology Code available for payment source

## 2019-09-11 ENCOUNTER — Encounter: Payer: Self-pay | Admitting: Hematology and Oncology

## 2019-09-11 VITALS — BP 105/74 | HR 96 | Temp 98.4°F | Resp 19 | Ht 60.0 in | Wt 113.4 lb

## 2019-09-11 DIAGNOSIS — Z79899 Other long term (current) drug therapy: Secondary | ICD-10-CM | POA: Diagnosis not present

## 2019-09-11 DIAGNOSIS — R233 Spontaneous ecchymoses: Secondary | ICD-10-CM | POA: Insufficient documentation

## 2019-09-11 DIAGNOSIS — K219 Gastro-esophageal reflux disease without esophagitis: Secondary | ICD-10-CM | POA: Diagnosis not present

## 2019-09-11 DIAGNOSIS — Z833 Family history of diabetes mellitus: Secondary | ICD-10-CM | POA: Insufficient documentation

## 2019-09-11 DIAGNOSIS — F329 Major depressive disorder, single episode, unspecified: Secondary | ICD-10-CM | POA: Diagnosis not present

## 2019-09-11 DIAGNOSIS — T148XXA Other injury of unspecified body region, initial encounter: Secondary | ICD-10-CM

## 2019-09-11 DIAGNOSIS — K589 Irritable bowel syndrome without diarrhea: Secondary | ICD-10-CM | POA: Insufficient documentation

## 2019-09-11 LAB — CMP (CANCER CENTER ONLY)
ALT: 7 U/L (ref 0–44)
AST: 16 U/L (ref 15–41)
Albumin: 4.5 g/dL (ref 3.5–5.0)
Alkaline Phosphatase: 39 U/L (ref 38–126)
Anion gap: 9 (ref 5–15)
BUN: 11 mg/dL (ref 6–20)
CO2: 27 mmol/L (ref 22–32)
Calcium: 9.5 mg/dL (ref 8.9–10.3)
Chloride: 102 mmol/L (ref 98–111)
Creatinine: 0.97 mg/dL (ref 0.44–1.00)
GFR, Est AFR Am: >60 mL/min (ref >60)
GFR, Estimated: >60 mL/min (ref >60)
Glucose, Bld: 83 mg/dL (ref 70–99)
Potassium: 4.4 mmol/L (ref 3.5–5.1)
Sodium: 138 mmol/L (ref 135–145)
Total Bilirubin: 0.4 mg/dL (ref 0.3–1.2)
Total Protein: 8.3 g/dL — ABNORMAL HIGH (ref 6.5–8.1)

## 2019-09-11 LAB — CBC WITH DIFFERENTIAL (CANCER CENTER ONLY)
Abs Immature Granulocytes: 0.01 10*3/uL (ref 0.00–0.07)
Basophils Absolute: 0 10*3/uL (ref 0.0–0.1)
Basophils Relative: 1 %
Eosinophils Absolute: 0.1 10*3/uL (ref 0.0–0.5)
Eosinophils Relative: 1 %
HCT: 37.7 % (ref 36.0–46.0)
Hemoglobin: 12.4 g/dL (ref 12.0–15.0)
Immature Granulocytes: 0 %
Lymphocytes Relative: 46 %
Lymphs Abs: 2.2 10*3/uL (ref 0.7–4.0)
MCH: 29.5 pg (ref 26.0–34.0)
MCHC: 32.9 g/dL (ref 30.0–36.0)
MCV: 89.5 fL (ref 80.0–100.0)
Monocytes Absolute: 0.4 10*3/uL (ref 0.1–1.0)
Monocytes Relative: 8 %
Neutro Abs: 2.1 10*3/uL (ref 1.7–7.7)
Neutrophils Relative %: 44 %
Platelet Count: 236 10*3/uL (ref 150–400)
RBC: 4.21 MIL/uL (ref 3.87–5.11)
RDW: 13.3 % (ref 11.5–15.5)
WBC Count: 4.8 10*3/uL (ref 4.0–10.5)
nRBC: 0 % (ref 0.0–0.2)

## 2019-09-11 LAB — PLATELET FUNCTION ASSAY: Collagen / Epinephrine: 158 seconds (ref 0–193)

## 2019-09-11 LAB — APTT: aPTT: 33 seconds (ref 24–36)

## 2019-09-11 LAB — PROTIME-INR
INR: 1.1 (ref 0.8–1.2)
Prothrombin Time: 13.4 seconds (ref 11.4–15.2)

## 2019-09-11 NOTE — Progress Notes (Signed)
[facility] [phone]   [phone]  INITIAL CONSULT NOTE  Patient Care Team: Dana Allan, MD as PCP - General (Family Medicine) Ettefagh, Aron Baba, MD as Consulting Physician (Pediatrics) Sharene Skeans Deanna Artis, MD as Consulting Physician (Pediatrics)  Hematological/Oncological History # Easy Bruising 1) 08/03/2019; PT 10.5, Plt 229, INR 1.0 2) 09/11/2019: establish care with Dr. Leonides Schanz   CHIEF COMPLAINTS/PURPOSE OF CONSULTATION:  "Easy Bruising"  HISTORY OF PRESENTING ILLNESS:  Bianca Forbes 24 y.o. female with medical history significant for GERD and IBS who presents for evaluation of easy bruising.   On review of the previous records Bianca Forbes had an evaluation for bruising on 08/03/2019.  At that time she was found to have a platelet count of 222, a PT of 10.5, and an INR of 1.0.  Of note she previously had thyroid testing done on 12/04/2018 at which time her TSH was 1.62 and her T4 and T3 uptake were within normal limits.  Due to concern for the patient's history of easy bruising she was referred to hematology for further evaluation management.  On exam today Bianca Forbes notes that the bruising she has been experiencing started in high school.  She notes that she randomly woke up one morning with a bruise that was "massive" and approximately the size of her fist.  She has a picture of this bruise which she provided during her visit today.  She notes that since that time she has had numerous bruises appear and go away.  She reports that there are 2 different types.  Some are reddish in color and others are "green".  She notes that the red ones really turn into the green bruises, but that they are never painful and almost always occur on the lower extremities below the level of the knee.  She notes that she has had one on her thigh before, but has never had any on her chest, abdomen, or arms.  She currently denies having any issues of heavy  bleeding, though she does report that at one point in time she had a menstrual cycle that lasted 1 month.  She notes that it at the time it was thought to be secondary to the birth control medication she was taking  She denies having any issues with nosebleeds or dark stools.  She notes that she has no family history remarkable for a bruising or bleeding disorder.  She is a never smoker and is currently a Consulting civil engineer of psychology.  Other than some chronic stomach issues associated with IBS she denies having any other new symptoms.  A full 10 point ROS is listed below.  MEDICAL HISTORY:  Past Medical History:  Diagnosis Date  . Depression   . GERD (gastroesophageal reflux disease)   . Headache(784.0)   . Irritable bowel syndrome   . Tinnitus    Saw ENT, assoc with emesis when occured    SURGICAL HISTORY: No past surgical history on file.  SOCIAL HISTORY: Social History   Socioeconomic History  . Marital status: Single    Spouse name: Not on file  . Number of children: 0  . Years of education: Not on file  . Highest education level: Bachelor's degree (e.g., BA, AB, BS)  Occupational History  . Not on file  Tobacco Use  . Smoking status: Never Smoker  . Smokeless tobacco: Never Used  Vaping Use  . Vaping Use: Never used  Substance and Sexual Activity  . Alcohol use: No    Alcohol/week:  0.0 standard drinks  . Drug use: No  . Sexual activity: Never  Other Topics Concern  . 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    She lives with both parents.   Right handed      Social Determinants of Health   Financial Resource Strain:   . Difficulty of Paying Living Expenses:   Food Insecurity:   . Worried About Programme researcher, broadcasting/film/video in the Last Year:   . Barista in the Last Year:   Transportation Needs:   . Freight forwarder  (Medical):   Marland Kitchen Lack of Transportation (Non-Medical):   Physical Activity:   . Days of Exercise per Week:   . Minutes of Exercise per Session:   Stress:   . Feeling of Stress :   Social Connections:   . Frequency of Communication with Friends and Family:   . Frequency of Social Gatherings with Friends and Family:   . Attends Religious Services:   . Active Member of Clubs or Organizations:   . Attends Banker Meetings:   Marland Kitchen Marital Status:   Intimate Partner Violence:   . Fear of Current or Ex-Partner:   . Emotionally Abused:   Marland Kitchen Physically Abused:   . Sexually Abused:     FAMILY HISTORY: Family History  Problem Relation Age of Onset  . Alzheimer's disease Maternal Grandmother        Died at 65  . Seizures Maternal Grandmother   . Kidney disease Maternal Grandfather        Died at 54  . Other Paternal Grandfather        Died at 23 from natural causes  . Diabetes Mother        Type 2  . Irritable bowel syndrome Father   . Lactose intolerance Father   . Seizures Cousin        Maternal 1st Cousin    ALLERGIES:  has No Known Allergies.  MEDICATIONS:  Current Outpatient Medications  Medication Sig Dispense Refill  . Baclofen 5 MG TABS Take 5 mg by mouth 3 (three) times daily. (Patient taking differently: Take 5 mg by mouth 3 (three) times daily. Takes as needed) 90 tablet 3  . buPROPion (WELLBUTRIN XL) 300 MG 24 hr tablet Take 300 mg by mouth daily.  1  . clonazePAM (KLONOPIN) 0.5 MG tablet TAKE 1 2 TO 1 (ONE HALF TO ONE) TABLET BY MOUTH TWICE DAILY AS NEEDED FOR ANXIETY AND PANIC ATTACK    . desvenlafaxine (PRISTIQ) 50 MG 24 hr tablet TAKE 1 TABLET BY MOUTH ONCE DAILY FOR ANXIETY    . Vitamin D, Ergocalciferol, (DRISDOL) 1.25 MG (50000 UNIT) CAPS capsule Take 1 capsule (50,000 Units total) by mouth every 7 (seven) days. 5 capsule 11   No current facility-administered medications for this visit.    REVIEW OF SYSTEMS:   Constitutional: ( - ) fevers, ( - )   chills , ( - ) night sweats Eyes: ( - ) blurriness of vision, ( - ) double vision, ( - ) watery eyes Ears, nose, mouth, throat, and face: ( - ) mucositis, ( - ) sore throat Respiratory: ( - ) cough, ( - ) dyspnea, ( - ) wheezes Cardiovascular: ( - ) palpitation, ( - ) chest discomfort, ( - )  lower extremity swelling Gastrointestinal:  ( - ) nausea, ( - ) heartburn, ( - ) change in bowel habits Skin: ( - ) abnormal skin rashes Lymphatics: ( - ) new lymphadenopathy, ( - ) easy bruising Neurological: ( - ) numbness, ( - ) tingling, ( - ) new weaknesses Behavioral/Psych: ( - ) mood change, ( - ) new changes  All other systems were reviewed with the patient and are negative.  PHYSICAL EXAMINATION: ECOG PERFORMANCE STATUS: 0 - Asymptomatic  Vitals:   09/11/19 1257  BP: 105/74  Pulse: 96  Resp: 19  Temp: 98.4 F (36.9 C)  SpO2: 99%   Filed Weights   09/11/19 1257  Weight: 113 lb 6.4 oz (51.4 kg)    GENERAL: well appearing young African American female in NAD  SKIN: skin color, texture, turgor are normal, no rashes or significant lesions. No active bruises on exam today. Left shoulder birthmark.  EYES: conjunctiva are pink and non-injected, sclera clear LUNGS: clear to auscultation and percussion with normal breathing effort HEART: regular rate & rhythm and no murmurs and no lower extremity edema Musculoskeletal: no cyanosis of digits and no clubbing  PSYCH: alert & oriented x 3, fluent speech NEURO: no focal motor/sensory deficits  LABORATORY DATA:  I have reviewed the data as listed CBC Latest Ref Rng & Units 08/03/2019 04/22/2019 12/04/2018  WBC 3.4 - 10.8 x10E3/uL 5.7 - 3.8  Hemoglobin 11.1 - 15.9 g/dL 34.1 93.7 90.2  Hematocrit 34.0 - 46.6 % 38.0 - 38.0  Platelets 150 - 450 x10E3/uL 229 - 259    CMP Latest Ref Rng & Units 12/04/2018 01/10/2016  Glucose 65 - 99 mg/dL 88 66  BUN 7 - 25 mg/dL 6(L) 8  Creatinine 4.09 - 1.10 mg/dL 7.35 3.29  Sodium 924 - 146 mmol/L 138 141    Potassium 3.5 - 5.3 mmol/L 4.4 5.3(H)  Chloride 98 - 110 mmol/L 104 106  CO2 20 - 32 mmol/L 25 24  Calcium 8.6 - 10.2 mg/dL 9.4 9.3  Total Protein 6.1 - 8.1 g/dL 7.2 7.1  Total Bilirubin 0.2 - 1.2 mg/dL 0.3 0.4  Alkaline Phos 47 - 176 U/L - 41(L)  AST 10 - 30 U/L 13 16  ALT 6 - 29 U/L 6 7    RADIOGRAPHIC STUDIES: No results found.  ASSESSMENT & PLAN Bianca Forbes 24 y.o. female with medical history significant for GERD and IBS who presents for evaluation of easy bruising.  After review the labs, and discussion with the patient the findings are most consistent with normal bruising of the lower extremities.  There are no findings at this time to suggest that the patient has a coagulopathy, but in order to effectively rule this out we will order a PTT as well as a platelet function assay to assure that there is no other possible etiology.  Additionally we will order a vitamin C level as vitamin C deficiency can result in weakened collagen and lower extremity bruising.    Overall it is common to see bruising on the lower extremities even without rigorous activity or known injury.  The lack of bruising elsewhere on the body as well as the lack of other bleeding symptoms is quite reassuring that the patient does not have a bleeding disorder.  I would recommend in the event the below labs are normal that the patient return if she were to develop new or worsening bleeding or bruising.  Otherwise no routine follow-up is required in our clinic.  # Easy  Bruising --today will order PT/INR and PTT. Prior PT/INR was WNL --additionally will collect a CBC and CMP --additional studies to include a Platelet Function Assay and Vitamin C level --bruising only occurring on the lower extremities with no other signs/symptoms of a bleeding disorder is reassuring that there is no underlying coagulopathy --recommend patient return to clinic if she were to have worsening bruising or new onset bleeding. --RTC  PRN, no routine f/u is required unless labs above show a concerning abnormality.   Orders Placed This Encounter  Procedures  . CBC with Differential (Cancer Center Only)    Standing Status:   Future    Standing Expiration Date:   09/10/2020  . CMP ([place] only)    Standing Status:   Future    Standing Expiration Date:   09/10/2020  . APTT    Standing Status:   Future    Standing Expiration Date:   09/10/2020  . Platelet function assay    Standing Status:   Future    Standing Expiration Date:   09/10/2020  . Vitamin C    All questions were answered. The patient knows to call the clinic with any problems, questions or concerns.  A total of more than 45 minutes were spent on this encounter and over half of that time was spent on counseling and coordination of care as outlined above.   Ledell Peoples, MD Department of Hematology/Oncology [facility] Phone: [phone] Pager: [phone] Email: [email or web address]  09/11/2019 1:32 PM

## 2019-09-14 ENCOUNTER — Other Ambulatory Visit: Payer: Self-pay

## 2019-09-14 ENCOUNTER — Other Ambulatory Visit (INDEPENDENT_AMBULATORY_CARE_PROVIDER_SITE_OTHER): Payer: No Typology Code available for payment source

## 2019-09-14 DIAGNOSIS — F332 Major depressive disorder, recurrent severe without psychotic features: Secondary | ICD-10-CM | POA: Diagnosis not present

## 2019-09-14 NOTE — Progress Notes (Signed)
Patient reported to [facility] for Repetitive Transcranial Magnetic Stimulation treatment for severe episode of recurrent major depressive disorder, without psychotic features. Patient presented with appropriate affect, level mood and denied any suicidal or homicidal ideations. Patient denies any other current symptoms and remains optimistic with continued TMS treatment. Patient reported no change in alcohol/substance use, caffeine consumption, sleep pattern or metal implant status since previous tx. Shewatched TV.Pt watched TV.Powerwas titrated to120% for the duration of txand will remain there until patient gets adjusted. Patient reported no complaints or discomfort. Patientdeparted post-treatment with no concerns or complaints. 

## 2019-09-15 LAB — VITAMIN C: Vitamin C: 1 mg/dL (ref 0.4–2.0)

## 2019-09-16 ENCOUNTER — Other Ambulatory Visit: Payer: Self-pay

## 2019-09-16 ENCOUNTER — Other Ambulatory Visit (INDEPENDENT_AMBULATORY_CARE_PROVIDER_SITE_OTHER): Payer: No Typology Code available for payment source

## 2019-09-16 ENCOUNTER — Telehealth: Payer: Self-pay | Admitting: Family Medicine

## 2019-09-16 DIAGNOSIS — F332 Major depressive disorder, recurrent severe without psychotic features: Secondary | ICD-10-CM | POA: Diagnosis not present

## 2019-09-16 NOTE — Telephone Encounter (Signed)
Pt is calling and needs the doctor to write a letter stating that she did refer her to [facility]. The insurance is wanting to make sure that her PCP referred her to this office. Please date of the referral. Pt would also send this through Mychart. jw

## 2019-09-16 NOTE — Telephone Encounter (Signed)
I think Dr. Talbert Forest seen this patient and referred.  Please ask Dr Talbert Forest to complete.  Thank you  Kenney Houseman

## 2019-09-16 NOTE — Progress Notes (Signed)
Patient reported to [facility] for Repetitive Transcranial Magnetic Stimulation treatment for severe episode of recurrent major depressive disorder, without psychotic features. Patient presented with appropriate affect, level mood and denied any suicidal or homicidal ideations. Patient denies any other current symptoms and remains optimistic with continued TMS treatment. Patient reported no change in alcohol/substance use, caffeine consumption, sleep pattern or metal implant status since previous tx. Shewatched TV.Pt watched TV.Powerwas titrated to120% for the duration of txand will remain there until patient gets adjusted. Patient reported no complaints or discomfort. Patientdeparted post-treatment with no concerns or complaints. 

## 2019-09-16 NOTE — Telephone Encounter (Signed)
Communication sent through my chart.  Referral was placed to hematology on 08/26/2019.

## 2019-09-17 ENCOUNTER — Telehealth: Payer: Self-pay | Admitting: *Deleted

## 2019-09-17 NOTE — Telephone Encounter (Signed)
-----   Message from Jaci Standard, MD sent at 09/15/2019  4:29 PM EDT ----- Please let Mrs. Vanhecke know that we did not find a clear source of her bruising problem. Her blood tests, including coagulation tests were all normal.   We do not have routine follow up planned. If she were to have worsening issues (such as bleeding or massive bruises) please have her give [place] a call.  Azucena Freed  ----- Message ----- From: Leory Plowman, [facility] [place] Sent: 09/11/2019   2:01 PM EDT To: Jaci Standard, MD

## 2019-09-17 NOTE — Telephone Encounter (Signed)
TCT patient regarding recent lab results from 09/11/19. No answer but was able to leave vm message to identified phone #. Advised that all labs were normal including coagulation studies No clear source of her bruising issues. Advised that patient did not need to follow up in this clinic but if she experiences any bleeding or massive bruising we can see her back here.

## 2019-09-18 ENCOUNTER — Other Ambulatory Visit: Payer: Self-pay

## 2019-09-18 ENCOUNTER — Other Ambulatory Visit (INDEPENDENT_AMBULATORY_CARE_PROVIDER_SITE_OTHER): Payer: No Typology Code available for payment source

## 2019-09-18 DIAGNOSIS — F332 Major depressive disorder, recurrent severe without psychotic features: Secondary | ICD-10-CM | POA: Diagnosis not present

## 2019-09-18 NOTE — Progress Notes (Signed)
Patient reported to [facility] for Repetitive Transcranial Magnetic Stimulation treatment for severe episode of recurrent major depressive disorder, without psychotic features. Patient presented with appropriate affect, level mood and denied any suicidal or homicidal ideations. Patient denies any other current symptoms and remains optimistic with continued TMS treatment. Patient reported no change in alcohol/substance use, caffeine consumption, sleep pattern or metal implant status since previous tx. Shewatched TV.Pt watched TV.Powerwas titrated to120% for the duration of txand will remain there until patient gets adjusted. Patient reported no complaints or discomfort. Patientdeparted post-treatment with no concerns or complaints. 

## 2019-09-21 ENCOUNTER — Other Ambulatory Visit: Payer: Self-pay

## 2019-09-21 ENCOUNTER — Other Ambulatory Visit (INDEPENDENT_AMBULATORY_CARE_PROVIDER_SITE_OTHER): Payer: No Typology Code available for payment source

## 2019-09-21 ENCOUNTER — Ambulatory Visit (INDEPENDENT_AMBULATORY_CARE_PROVIDER_SITE_OTHER): Payer: No Typology Code available for payment source | Admitting: Psychology

## 2019-09-21 DIAGNOSIS — F330 Major depressive disorder, recurrent, mild: Secondary | ICD-10-CM

## 2019-09-21 DIAGNOSIS — F4312 Post-traumatic stress disorder, chronic: Secondary | ICD-10-CM | POA: Diagnosis not present

## 2019-09-21 DIAGNOSIS — F411 Generalized anxiety disorder: Secondary | ICD-10-CM

## 2019-09-21 DIAGNOSIS — F332 Major depressive disorder, recurrent severe without psychotic features: Secondary | ICD-10-CM | POA: Diagnosis not present

## 2019-09-21 NOTE — Progress Notes (Signed)
Patient reported to [facility] for Repetitive Transcranial Magnetic Stimulation treatment for severe episode of recurrent major depressive disorder, without psychotic features. Patient presented with appropriate affect, level mood and denied any suicidal or homicidal ideations. Patient denies any other current symptoms and remains optimistic with continued TMS treatment. Patient reported no change in alcohol/substance use, caffeine consumption, sleep pattern or metal implant status since previous tx. Shewatched TV.Pt watched TV.Powerwas titrated to120% for the duration of txand will remain there until patient gets adjusted. Patient reported no complaints or discomfort. Patientdeparted post-treatment with no concerns or complaints.Pt completed a PHQ-9 with a score of 6.

## 2019-09-22 ENCOUNTER — Other Ambulatory Visit: Payer: Self-pay

## 2019-09-22 ENCOUNTER — Other Ambulatory Visit (INDEPENDENT_AMBULATORY_CARE_PROVIDER_SITE_OTHER): Payer: No Typology Code available for payment source

## 2019-09-22 DIAGNOSIS — F332 Major depressive disorder, recurrent severe without psychotic features: Secondary | ICD-10-CM | POA: Diagnosis not present

## 2019-09-22 NOTE — Progress Notes (Signed)
Patient reported to [facility] for Repetitive Transcranial Magnetic Stimulation treatment for severe episode of recurrent major depressive disorder, without psychotic features. Patient presented with appropriate affect, level mood and denied any suicidal or homicidal ideations. Patient denies any other current symptoms and remains optimistic with continued TMS treatment. Patient reported no change in alcohol/substance use, caffeine consumption, sleep pattern or metal implant status since previous tx. Shewatched TV.Pt watched TV.Powerwas titrated to120% for the duration of txand will remain there until patient gets adjusted. Patient reported no complaints or discomfort. Patientdeparted post-treatment with no concerns or complaints. 

## 2019-09-23 ENCOUNTER — Other Ambulatory Visit: Payer: Self-pay

## 2019-09-23 ENCOUNTER — Other Ambulatory Visit (INDEPENDENT_AMBULATORY_CARE_PROVIDER_SITE_OTHER): Payer: No Typology Code available for payment source

## 2019-09-23 DIAGNOSIS — F332 Major depressive disorder, recurrent severe without psychotic features: Secondary | ICD-10-CM

## 2019-09-23 NOTE — Progress Notes (Signed)
Patient reported to [facility] for Repetitive Transcranial Magnetic Stimulation treatment for severe episode of recurrent major depressive disorder, without psychotic features. Patient presented with appropriate affect, level mood and denied any suicidal or homicidal ideations. Patient denies any other current symptoms and remains optimistic with continued TMS treatment. Patient reported no change in alcohol/substance use, caffeine consumption, sleep pattern or metal implant status since previous tx. Shewatched TV.Pt watched TV.Powerwas titrated to120% for the duration of txand will remain there until patient gets adjusted. Patient reported no complaints or discomfort. Patientdeparted post-treatment with no concerns or complaints. 

## 2019-09-24 ENCOUNTER — Other Ambulatory Visit: Payer: Self-pay

## 2019-09-24 ENCOUNTER — Other Ambulatory Visit (INDEPENDENT_AMBULATORY_CARE_PROVIDER_SITE_OTHER): Payer: No Typology Code available for payment source

## 2019-09-24 ENCOUNTER — Telehealth: Payer: Self-pay

## 2019-09-24 DIAGNOSIS — F332 Major depressive disorder, recurrent severe without psychotic features: Secondary | ICD-10-CM

## 2019-09-24 NOTE — Progress Notes (Signed)
Patient reported to [name] for Repetitive Transcranial Magnetic Stimulation treatment for severe episode of recurrent major depressive disorder, without psychotic features. Patient presented with appropriate affect, level mood and denied any suicidal or homicidal ideations. Patient denies any other current symptoms and remains optimistic with continued TMS treatment. Patient reported no change in alcohol/substance use, caffeine consumption, sleep pattern or metal implant status since previous tx. Shewatched TV.Pt watched TV.Powerwas titrated to120% for the duration of txand will remain there until patient gets adjusted. Patient reported no complaints or discomfort. Patientdeparted post-treatment with no concerns or complaints. 

## 2019-09-24 NOTE — Telephone Encounter (Signed)
Changed to easy bruising.

## 2019-09-24 NOTE — Telephone Encounter (Signed)
Patient calls nurse line stating the diagnose codes for her lab work on 24-Aug-2022 need to be changed. Patient reports her insurance rejected the claim as labs were coded using something about "other accidents or injuries." Per epic, the diagnose used was Bruising, however looks like the code itself does reflect accident or injury (according to google.) Is there a way this can be changed?

## 2019-09-25 ENCOUNTER — Encounter (HOSPITAL_COMMUNITY): Payer: No Typology Code available for payment source

## 2019-09-28 ENCOUNTER — Other Ambulatory Visit (INDEPENDENT_AMBULATORY_CARE_PROVIDER_SITE_OTHER): Payer: No Typology Code available for payment source

## 2019-09-28 ENCOUNTER — Other Ambulatory Visit: Payer: Self-pay

## 2019-09-28 DIAGNOSIS — F332 Major depressive disorder, recurrent severe without psychotic features: Secondary | ICD-10-CM | POA: Diagnosis not present

## 2019-09-28 NOTE — Progress Notes (Signed)
Patient reported to [facility] for Repetitive Transcranial Magnetic Stimulation treatment for severe episode of recurrent major depressive disorder, without psychotic features. Patient presented with appropriate affect, level mood and denied any suicidal or homicidal ideations. Patient denies any other current symptoms and remains optimistic with continued TMS treatment. Patient reported no change in alcohol/substance use, caffeine consumption, sleep pattern or metal implant status since previous tx. Shewatched TV.Pt watched TV.Powerwas titrated to120% for the duration of txand will remain there until patient gets adjusted. Patient reported no complaints or discomfort. Patientdeparted post-treatment with no concerns or complaints. 

## 2019-09-29 ENCOUNTER — Other Ambulatory Visit (INDEPENDENT_AMBULATORY_CARE_PROVIDER_SITE_OTHER): Payer: No Typology Code available for payment source

## 2019-09-29 ENCOUNTER — Other Ambulatory Visit: Payer: Self-pay

## 2019-09-29 DIAGNOSIS — F332 Major depressive disorder, recurrent severe without psychotic features: Secondary | ICD-10-CM | POA: Diagnosis not present

## 2019-09-29 NOTE — Progress Notes (Signed)
Patient reported to [facility] for Repetitive Transcranial Magnetic Stimulation treatment for severe episode of recurrent major depressive disorder, without psychotic features. Patient presented with appropriate affect, level mood and denied any suicidal or homicidal ideations. Patient denies any other current symptoms and remains optimistic with continued TMS treatment. Patient reported no change in alcohol/substance use, caffeine consumption, sleep pattern or metal implant status since previous tx. Shewatched TV.Pt watched TV.Powerwas titrated to120% for the duration of txand will remain there until patient gets adjusted. Patient reported no complaints or discomfort. Patientdeparted post-treatment with no concerns or complaints. 

## 2019-09-30 ENCOUNTER — Other Ambulatory Visit (INDEPENDENT_AMBULATORY_CARE_PROVIDER_SITE_OTHER): Payer: No Typology Code available for payment source

## 2019-09-30 ENCOUNTER — Other Ambulatory Visit: Payer: Self-pay

## 2019-09-30 DIAGNOSIS — F332 Major depressive disorder, recurrent severe without psychotic features: Secondary | ICD-10-CM | POA: Diagnosis not present

## 2019-09-30 NOTE — Progress Notes (Signed)
Patient reported to [facility] for Repetitive Transcranial Magnetic Stimulation treatment for severe episode of recurrent major depressive disorder, without psychotic features. Patient presented with appropriate affect, level mood and denied any suicidal or homicidal ideations. Patient denies any other current symptoms and remains optimistic with continued TMS treatment. Patient reported no change in alcohol/substance use, caffeine consumption, sleep pattern or metal implant status since previous tx. Shewatched TV.Pt watched TV.Powerwas titrated to120% for the duration of txand will remain there until patient gets adjusted. Patient reported no complaints or discomfort. Patientdeparted post-treatment with no concerns or complaints.Pt completed a PHQ-9 with a score of 4.

## 2019-10-08 ENCOUNTER — Other Ambulatory Visit: Payer: Self-pay

## 2019-10-08 ENCOUNTER — Other Ambulatory Visit (HOSPITAL_COMMUNITY): Payer: No Typology Code available for payment source | Attending: Psychiatry

## 2019-10-08 DIAGNOSIS — F332 Major depressive disorder, recurrent severe without psychotic features: Secondary | ICD-10-CM

## 2019-10-08 NOTE — Progress Notes (Signed)
Patient reported to [facility] for Repetitive Transcranial Magnetic Stimulation treatment for severe episode of recurrent major depressive disorder, without psychotic features. Patient presented with appropriate affect, level mood and denied any suicidal or homicidal ideations. Patient denies any other current symptoms and remains optimistic with continued TMS treatment. Patient reported no change in alcohol/substance use, caffeine consumption, sleep pattern or metal implant status since previous tx. Shewatched TV.Pt watched TV.Powerwas titrated to120% for the duration of txand will remain there until patient gets adjusted. Patient reported no complaints or discomfort. Patientdeparted post-treatment with no concerns or complaints.

## 2019-10-09 ENCOUNTER — Other Ambulatory Visit (INDEPENDENT_AMBULATORY_CARE_PROVIDER_SITE_OTHER): Payer: No Typology Code available for payment source

## 2019-10-09 ENCOUNTER — Other Ambulatory Visit: Payer: Self-pay

## 2019-10-09 DIAGNOSIS — F332 Major depressive disorder, recurrent severe without psychotic features: Secondary | ICD-10-CM

## 2019-10-09 NOTE — Progress Notes (Signed)
Patient reported to [facility] for Repetitive Transcranial Magnetic Stimulation treatment for severe episode of recurrent major depressive disorder, without psychotic features. Patient presented with appropriate affect, level mood and denied any suicidal or homicidal ideations. Patient denies any other current symptoms and remains optimistic with continued TMS treatment. Patient reported no change in alcohol/substance use, caffeine consumption, sleep pattern or metal implant status since previous tx. Shewatched TV.Pt watched TV.Powerwas titrated to120% for the duration of txand will remain there until patient gets adjusted. Patient reported no complaints or discomfort. Patientdeparted post-treatment with no concerns or complaints.Pt scored a 2 on the PHQ9.

## 2019-10-12 ENCOUNTER — Other Ambulatory Visit (INDEPENDENT_AMBULATORY_CARE_PROVIDER_SITE_OTHER): Payer: No Typology Code available for payment source

## 2019-10-12 ENCOUNTER — Other Ambulatory Visit: Payer: Self-pay

## 2019-10-12 DIAGNOSIS — F332 Major depressive disorder, recurrent severe without psychotic features: Secondary | ICD-10-CM

## 2019-10-12 NOTE — Progress Notes (Signed)
Patient reported to [facility] for Repetitive Transcranial Magnetic Stimulation treatment for severe episode of recurrent major depressive disorder, without psychotic features. Patient presented with appropriate affect, level mood and denied any suicidal or homicidal ideations. Patient denies any other current symptoms and remains optimistic with continued TMS treatment. Patient reported no change in alcohol/substance use, caffeine consumption, sleep pattern or metal implant status since previous tx. Shewatched TV.Pt watched TV.Powerwas titrated to120% for the duration of txand will remain there until patient gets adjusted. Patient reported no complaints or discomfort. Patientdeparted post-treatment with no concerns or complaints.Pt scored a 3 on the PHQ-9.

## 2019-10-19 ENCOUNTER — Ambulatory Visit: Payer: No Typology Code available for payment source | Admitting: Psychology

## 2019-10-21 ENCOUNTER — Other Ambulatory Visit: Payer: Self-pay

## 2019-10-21 ENCOUNTER — Encounter: Payer: Self-pay | Admitting: Family Medicine

## 2019-10-21 ENCOUNTER — Ambulatory Visit (INDEPENDENT_AMBULATORY_CARE_PROVIDER_SITE_OTHER): Payer: No Typology Code available for payment source | Admitting: Family Medicine

## 2019-10-21 DIAGNOSIS — K580 Irritable bowel syndrome with diarrhea: Secondary | ICD-10-CM | POA: Diagnosis not present

## 2019-10-21 MED ORDER — DICYCLOMINE HCL 10 MG PO CAPS: 10.0000 mg | ORAL_CAPSULE | Freq: Three times a day (TID) | ORAL | 0 refills | Status: DC

## 2019-10-21 NOTE — Assessment & Plan Note (Signed)
Patient said symptoms managed well in past with Dicyclamine. - Prescribed Dicyclamine 10 mg 4 times a day as needed - Instructed patient to come back if she had more severe symptoms or other concerns

## 2019-10-21 NOTE — Patient Instructions (Signed)
It was good to see you today.  Thank you for coming in.  I think you have Irritable Bowel Syndrome.      I am prescribing you Dicyclomine to be taken 3 times a day.  If you have any side effects or issues please call or come back to see [place].   Be Well Bianca Kussmaul MD

## 2019-10-21 NOTE — Progress Notes (Signed)
    SUBJECTIVE:   CHIEF COMPLAINT / HPI: Medication Refill  Bianca Forbes is a 24 yo female with GI concerns.  She has been diagnosed with IBS in past and indicates it usually acts up when she is stressed.  Indicates she is currently stressed as she is in the process of moving to [place] to pursue Masters degree. Indicates she usually gets symptoms involving gas and diarrhea.  Has previously been successfully treated in past with Dicyclamine and is wondering if she can be prescribed again.   PERTINENT  PMH / PSH: Generalized Anxiety Disorder Irritable Bowel Syndrome  OBJECTIVE:   BP (!) 92/54   Pulse (!) 105   Ht 5' (1.524 m)   Wt 112 lb 12.8 oz (51.2 kg)   LMP 09/27/2019 (Approximate)   SpO2 98%   BMI 22.03 kg/m    Physical Exam Constitutional:      Appearance: Normal appearance.     Comments: Patient somewhat anxious during exam  HENT:     Head: Normocephalic and atraumatic.  Cardiovascular:     Rate and Rhythm: Normal rate and regular rhythm.  Pulmonary:     Effort: Pulmonary effort is normal.     Breath sounds: Normal breath sounds.  Neurological:     Mental Status: She is alert.     ASSESSMENT/PLAN:   Irritable bowel syndrome Patient said symptoms managed well in past with Dicyclamine. - Prescribed Dicyclamine 10 mg 4 times a day as needed - Instructed patient to come back if she had more severe symptoms or other concerns    Jovita Kussmaul, MD [facility]

## 2019-11-12 ENCOUNTER — Telehealth (HOSPITAL_COMMUNITY): Payer: No Typology Code available for payment source | Admitting: Psychiatry

## 2019-11-30 ENCOUNTER — Ambulatory Visit: Payer: No Typology Code available for payment source | Admitting: Neurology

## 2019-12-17 ENCOUNTER — Telehealth: Payer: Self-pay

## 2019-12-17 NOTE — Telephone Encounter (Signed)
Referral  Type of referral: Neurology   Provider Name: first available at [facility]   Phone: [phone]  Fax: [phone]  Address: n/a   Appointment Date and Time: Tuesday October 27, 2019 at 3pm.

## 2020-06-30 ENCOUNTER — Other Ambulatory Visit: Payer: Self-pay | Admitting: *Deleted

## 2020-08-04 ENCOUNTER — Encounter (INDEPENDENT_AMBULATORY_CARE_PROVIDER_SITE_OTHER): Payer: Self-pay

## 2021-04-13 DIAGNOSIS — K644 Residual hemorrhoidal skin tags: Secondary | ICD-10-CM

## 2021-04-13 DIAGNOSIS — K641 Second degree hemorrhoids: Secondary | ICD-10-CM

## 2021-04-13 HISTORY — DX: Second degree hemorrhoids: K64.1

## 2021-04-13 HISTORY — DX: Residual hemorrhoidal skin tags: K64.4

## 2021-08-14 IMAGING — MR MR HEAD W/O CM
10 series · 47 of 48 positions shown · non-contrast
Comparison: 01/01/2012

CLINICAL DATA: Chronic headache with spasms in the temple.

EXAM:
MRI HEAD WITHOUT CONTRAST
TECHNIQUE: Multiplanar, multiecho pulse sequences of the brain and surrounding
structures were obtained without intravenous contrast.

[Series 2: T1 · sagittal · 5.0mm · 0.45mm/px · 2 of 21 slices shown]
[im 1/21]
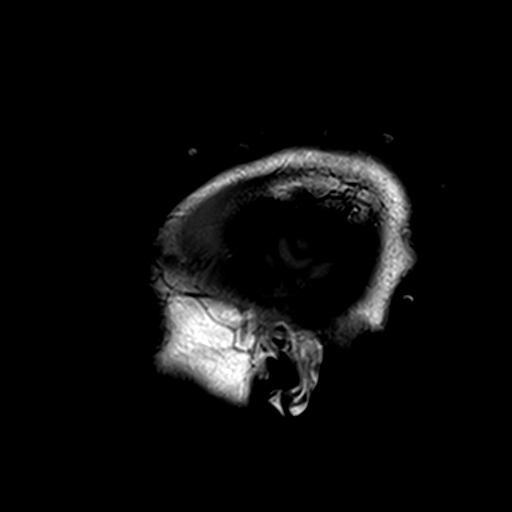
[im 21/21]
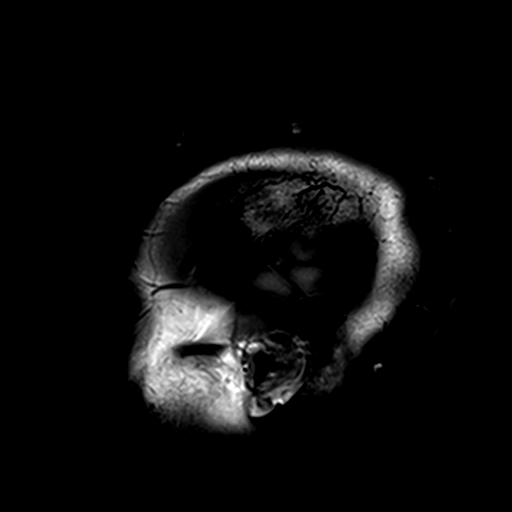

[Series 3: DWI · axial · 3.0mm · 1.80mm/px · z∈[-55,+89]mm · 8 of 100 slices shown (1 of 4)]
[im 1/100]
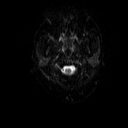
[im 15/100]
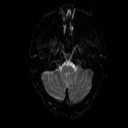
[im 29/100]
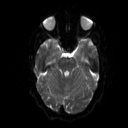
[im 43/100]
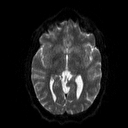
[im 57/100]
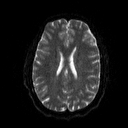
[im 71/100]
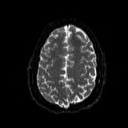
[im 85/100]
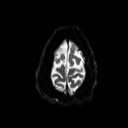
[im 100/100]
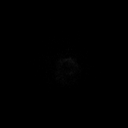

[Series 4: DWI · axial · 3.0mm · 1.80mm/px · z∈[-55,+89]mm · 4 of 50 slices shown (2 of 4)]
[im 1/50]
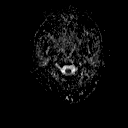
[im 17/50]
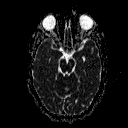
[im 33/50]
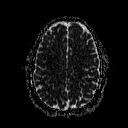
[im 50/50]
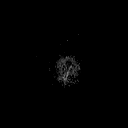

[Series 6: swi_images · axial · 2.0mm · 0.90mm/px · z∈[-60,+95]mm · 6 of 80 slices shown]
[im 1/80]
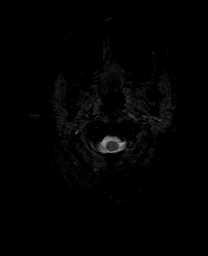
[im 16/80]
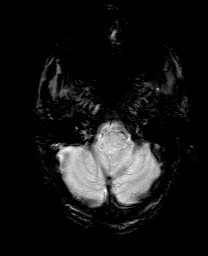
[im 32/80]
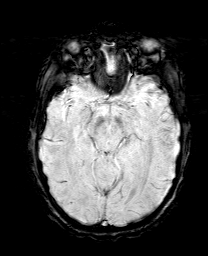
[im 48/80]
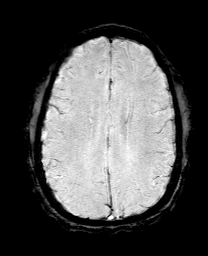
[im 64/80]
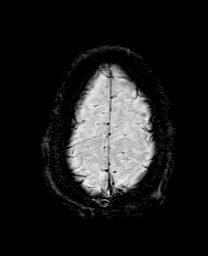
[im 80/80]
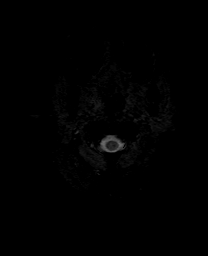

[Series 7: DWI · coronal · 5.0mm · 1.80mm/px · 6 of 71 slices shown (3 of 4)]
[im 1/71]
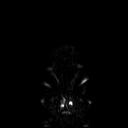
[im 15/71]
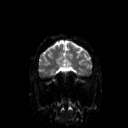
[im 29/71]
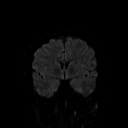
[im 43/71]
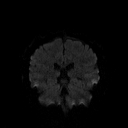
[im 57/71]
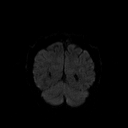
[im 71/71]
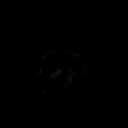

[Series 8: DWI · coronal · 5.0mm · 1.80mm/px · 3 of 36 slices shown (4 of 4)]
[im 1/36]
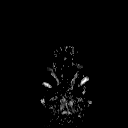
[im 18/36]
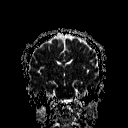
[im 36/36]
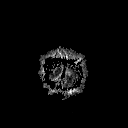

[Series 9: T2 · axial · 5.0mm · 0.51mm/px · z∈[-52,+88]mm · 2 of 22 slices shown (1 of 2)]
[im 1/22]
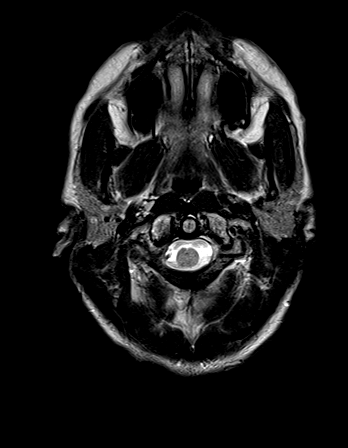
[im 22/22]
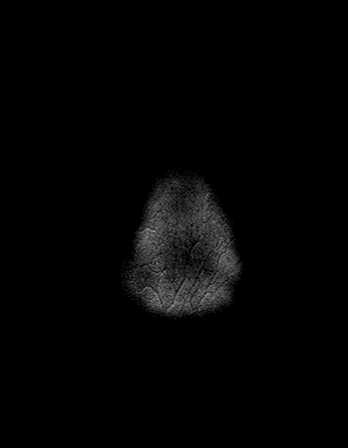

[Series 10: FLAIR · axial · 3.0mm · 0.45mm/px · z∈[-60,+94]mm · 2 of 27 slices shown]
[im 1/27]
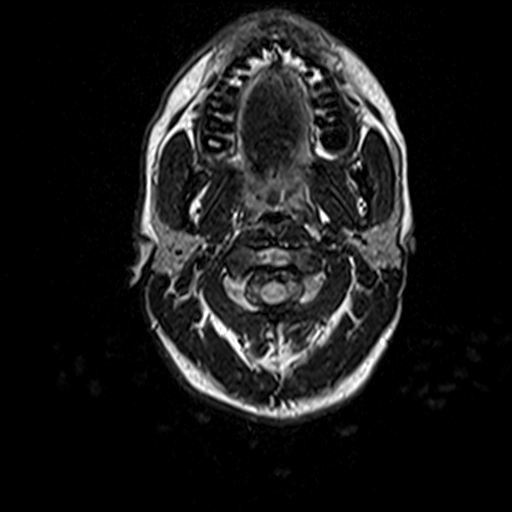
[im 27/27]
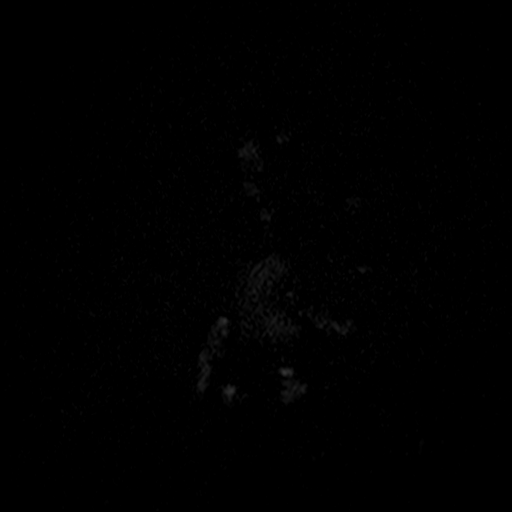

[Series 11: t1_mpr_tra copy center · axial · 1.0mm · 0.45mm/px · z∈[-60,+96]mm · 12 of 160 slices shown]
[im 1/160]
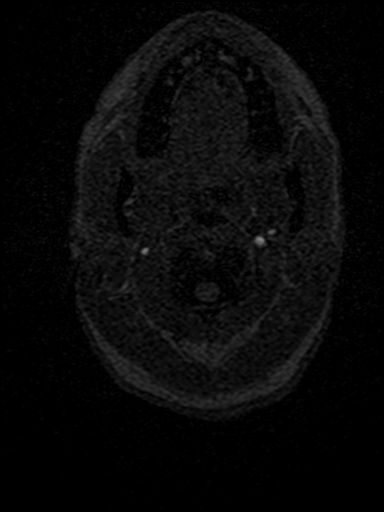
[im 14/160]
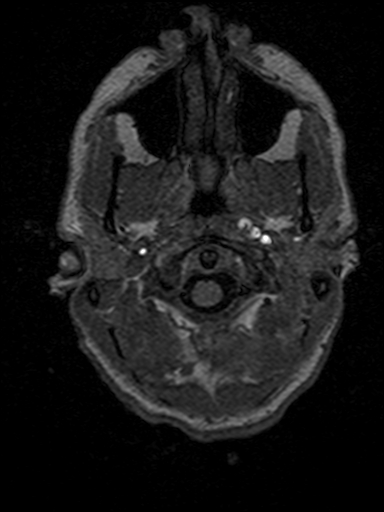
[im 27/160]
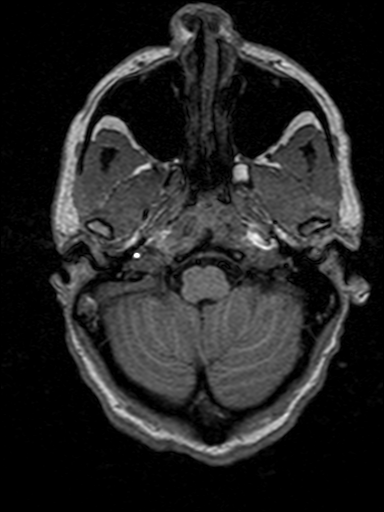
[im 40/160]
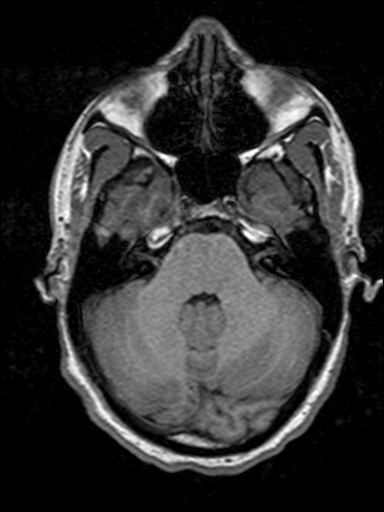
[im 54/160]
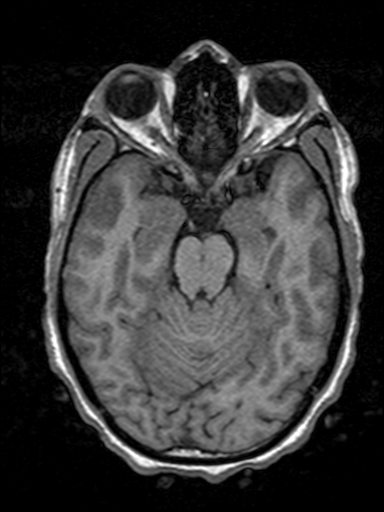
[im 67/160]
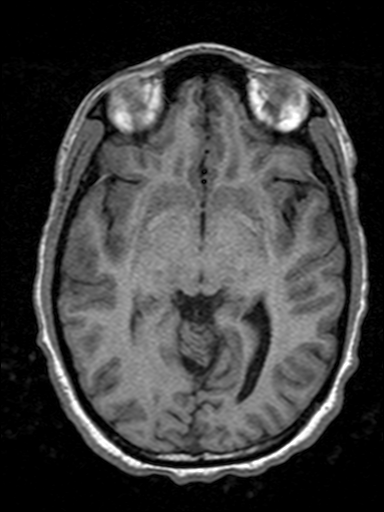
[im 80/160]
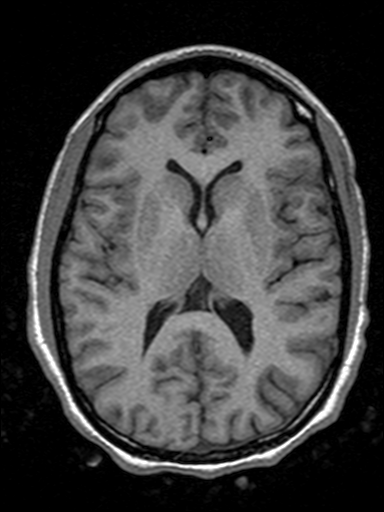
[im 93/160]
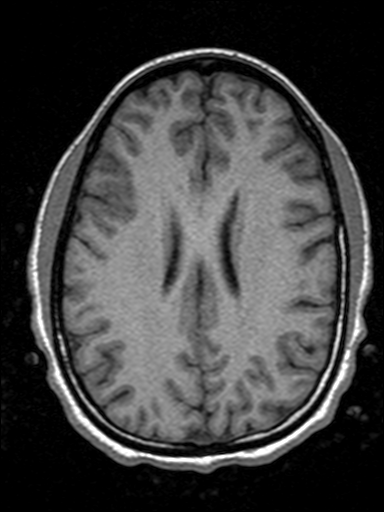
[im 107/160]
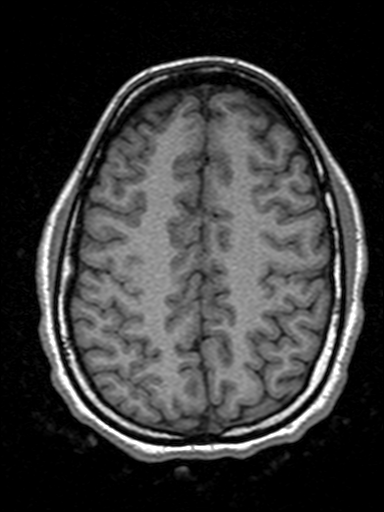
[im 120/160]
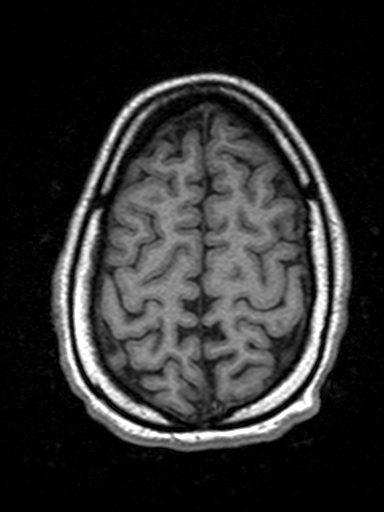
[im 133/160]
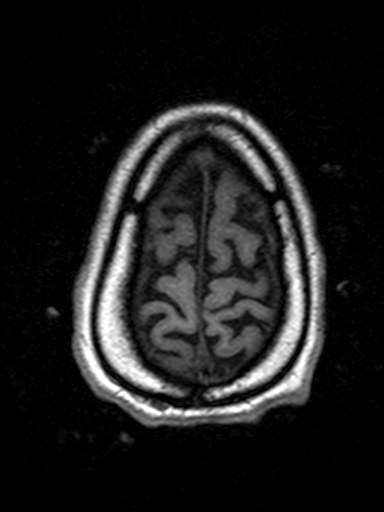
[im 160/160]
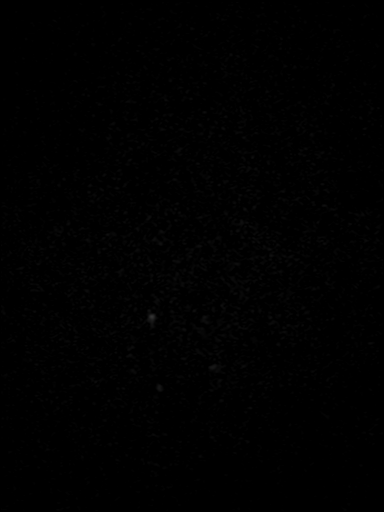

[Series 12: T2 · coronal · 5.0mm · 0.45mm/px · 2 of 31 slices shown (2 of 2)]
[im 1/31]
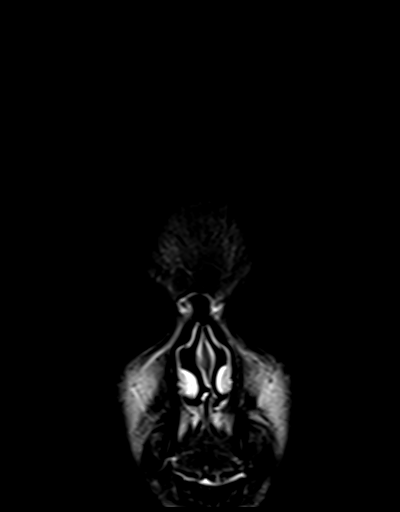
[im 31/31]
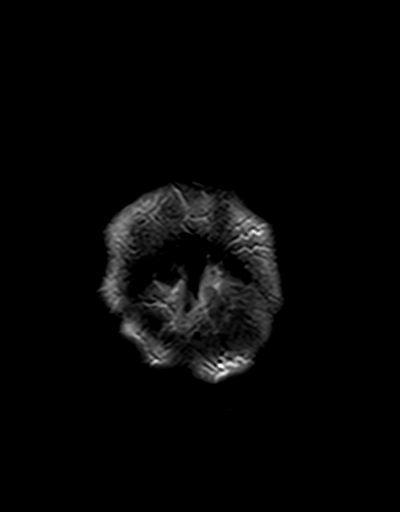

[47 of 48 positions shown; findings below may reference images not displayed]

FINDINGS: Brain: No infarct, hemorrhage, hydrocephalus, extra-axial collection
or mass lesion. Normal brain volume and morphology.

Vascular: Preserved flow voids. Small lower basilar in the setting
of persistent trigeminal artery on the left. This likely accounts
for the asymmetric smaller right ICA in the neck.

Skull and upper cervical spine: Normal marrow signal.

Sinuses/Orbits: Negative
IMPRESSION: Stable exam with no explanation for headache.

## 2021-09-05 ENCOUNTER — Encounter: Payer: Self-pay | Admitting: *Deleted

## 2021-12-12 DIAGNOSIS — Z8719 Personal history of other diseases of the digestive system: Secondary | ICD-10-CM | POA: Insufficient documentation

## 2021-12-12 HISTORY — DX: Personal history of other diseases of the digestive system: Z87.19

## 2022-01-19 LAB — HM PAP SMEAR: HPV, high-risk: NEGATIVE

## 2022-04-02 HISTORY — PX: OTHER SURGICAL HISTORY: SHX169

## 2022-11-22 ENCOUNTER — Telehealth (HOSPITAL_COMMUNITY): Payer: Self-pay

## 2022-11-22 NOTE — Telephone Encounter (Signed)
TMS C: Received incoming call from pt, pt left a voicemail hoping to schedule an appt for TMS. Pt stated she has had TMS tx before and found remission of symptoms for 2 years before symptoms returned.  Coordinator placed outgoing call to pt. Coordinator conducted phone screening (Ref form, PHQ-9, Beck's) and reviewed scores. Pt is eligible for treatment and scored between mod-severe. Pt wants to wait till September for consultation d/t new insurance not yet being active. Coordinator stated they would check available times with provider and give a call back once info is obtained.

## 2022-11-27 ENCOUNTER — Telehealth (HOSPITAL_COMMUNITY): Payer: Self-pay

## 2022-11-27 NOTE — Telephone Encounter (Signed)
TMS C: Place outgoing call to patient to answer billing question in regard to consult (pt aware consult is billed to insurance). Pt informed Coordinator that their insurance will be active as of 12/02/22. Pt requested consult date of 12/06/22. Coordinator will verify with TMS provider and call pt with confirmation.

## 2022-11-28 ENCOUNTER — Telehealth (HOSPITAL_COMMUNITY): Payer: Self-pay

## 2022-11-28 NOTE — Telephone Encounter (Signed)
TMS C: Placed outgoing call to patient, call immediately went to voicemail. Coordinator left vm confirming consultation appt with Dr. Renaldo Fiddler on 12/06/22 at 2pm. Coordinator informed patient to call anytime with questions/concerns, but to call specifically after consult to confirm if they want to move forward with TMS tx.Marland Kitchen

## 2022-12-05 ENCOUNTER — Telehealth (HOSPITAL_COMMUNITY): Payer: Self-pay

## 2022-12-05 NOTE — Telephone Encounter (Signed)
TMS C: Pt returned call to Coordinator and requested the TMS consult to be moved for the following Monday (12/10/22) due to easier accessibility with work schedule. Coordinator informed patient that it shouldn't be a problem, but will let them know what Provider says. Patient requested info about how soon treatments could start after consult - Coordinator explained prior auth process could be started the following Tuesday and may take 1-2 weeks to hear about approval. Coordinator informed pt that they will do their best to speed process up but will try to get them started by middle of September. Patient agreed and was amiable at conclusion of call.

## 2022-12-05 NOTE — Telephone Encounter (Signed)
TMS C: Coordinator placed outgoing call to patient informing them of consultation update. No response, left vm. VM convo = Coordinator apologized for missed call and explained reasoning (illness). Coordinator stated that Dr. Renaldo Fiddler has agreed to being flexible to when he can see them for consult; patient does not have to adhere to 2pm time frame if not able to. Coordinator explained section of inpatient wing Dr. Demetrius Charity is located in, and that consult shouldn't have to take full hour since patient is familiar with process. Coordinator ended vm telling patient to please call if they have anymore questions, and that next week when Coordinator returns, process will be a lot smoother.

## 2022-12-06 ENCOUNTER — Other Ambulatory Visit (HOSPITAL_COMMUNITY): Payer: Medicaid Other | Admitting: Student in an Organized Health Care Education/Training Program

## 2022-12-10 ENCOUNTER — Encounter (HOSPITAL_COMMUNITY): Payer: Self-pay | Admitting: Student in an Organized Health Care Education/Training Program

## 2022-12-10 ENCOUNTER — Other Ambulatory Visit (HOSPITAL_BASED_OUTPATIENT_CLINIC_OR_DEPARTMENT_OTHER): Payer: 59 | Admitting: Student in an Organized Health Care Education/Training Program

## 2022-12-10 ENCOUNTER — Encounter (HOSPITAL_COMMUNITY): Payer: Self-pay

## 2022-12-10 DIAGNOSIS — F411 Generalized anxiety disorder: Secondary | ICD-10-CM

## 2022-12-10 DIAGNOSIS — F329 Major depressive disorder, single episode, unspecified: Secondary | ICD-10-CM | POA: Diagnosis not present

## 2022-12-10 NOTE — Progress Notes (Signed)
[employer] Consult Note  12/10/2022 2:50 PM Bianca Forbes  MRN:  [id number]  Chief Complaint:  Chief Complaint  Patient presents with   TMS Consult   HPI:  Bianca Forbes is a 27 yr old female who presents for TMS Consult.  PPHx is significant for MDD, GAD, and PTSD, and 1 Prior Suicide Attempt (OD- age 12), and no history of Self Injurious Behavior or Psychiatric Hospitalizations.  She reports that her symptoms first started around age 30.  She reports that she had trouble getting out of bed and a decrease in her hygiene levels.  She reports being trialed on multiple medications over the years.  She reports some of them would work for a little bit and it has not being effective while others she had to stop due to side effects.  She reports that she underwent TMS in 2021 and she had a positive response to this.  She reports that life started throwing more things at her and she started worsening again.  She reports that at the beginning of this year her anxiety started returning.  She reports may was the worst point of her symptoms.  She reports that due to her current symptoms she turned down starting her doctorate as she knows she is not able to handle this at this time.  She reports she is currently on Wellbutrin for 150 mg a day, Pristiq 50 mg a day, and Klonopin 0.5 mg 1/2 to 1 tablet twice daily as needed.  She reports in the past she has also been on Cymbalta, Zoloft, Prozac, Lexapro, Celexa, and Abilify.  She reports past psychiatric history significant for MDD, GAD, and PTSD.  She reports a suicide attempt at age 79 via overdose.  She reports no history of self-injurious behavior.  She reports no history of psychiatric hospitalizations.  She reports no significant past medical history.  She reports past surgical history significant for wisdom tooth removal x4.  She reports no history of head trauma.  She reports no history of seizures.  She reports NKDA.  She reports currently lives at her  father's house.  She just finished her masters in psychology in May 2024.  She reports no alcohol use.  She reports no tobacco use.  She reports no illicit substance use.  She reports no current legal issues.  She reports no access to firearms.  She reports no SI, HI, or AVH.  Discussed TMS with her.  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  Discussed the first appointment and what is involved with finding the MT.  Discussed with her that afterwards treatment sessions would last approximately 20 minutes.  Discussed that treatments are Monday through Friday for 6 weeks and then there is a taper period.  Discussed that there is no definitive timeline for how long results would last but that typically we expect at least 1 year of improvement to consider additional treatments in the future.  Discussed that she should continue taking their medications as prescribed by their outpatient provider discussed that the tech would run the treatments afterwards with their parameters saved into the system, however, if any questions or issues arose I would be immediately available by phone or could be on site.  Discussed potential risks and side effects including but not limited to: Discomfort at site of magnet placement, headache, or seizure.  Confirmed that there was no implanted/retained metal in the head (aneurysm clips, plates, screws).  Confirmed that there is no significant dental/bridge work.  Confirmed that there is no implanted pacemaker/defibrillator.  Discussed that jewelry should not be worn or that it should be removed from the ear for treatment.  Discussed that [facility] are done at [facility].  Discussed that treatment room can have lights dimmed, music played, or have the TV on during treatment sessions for comfort.  All questions were invited and answered. She were agreeable to proceed with MT appointment.   Visit Diagnosis:    ICD-10-CM   1. Treatment-resistant depression   F32.9     2. Anxiety state  F41.1       Past Psychiatric History: MDD, GAD, and PTSD, and 1 Prior Suicide Attempt (OD- age 62), and no history of Self Injurious Behavior or Psychiatric Hospitalizations.  Past Medical History:  Past Medical History:  Diagnosis Date   Depression    GERD (gastroesophageal reflux disease)    Headache(784.0)    Irritable bowel syndrome    Tinnitus    Saw ENT, assoc with emesis when occured   No past surgical history on file.  Family Psychiatric History:  Mother- Depression Sister- Psychosis Maternal Grandfather- EtOH Abuse Paternal Grandmother- Anxiety No Known Suicides  Family History:  Family History  Problem Relation Age of Onset   Alzheimer's disease Maternal Grandmother        Died at 60   Seizures Maternal Grandmother    Kidney disease Maternal Grandfather        Died at 38   Other Paternal Grandfather        Died at 36 from natural causes   Diabetes Mother        Type 2   Irritable bowel syndrome Father    Lactose intolerance Father    Seizures Cousin        Maternal 1st Cousin    Social History:  Social History   Socioeconomic History   Marital status: Single    Spouse name: Not on file   Number of children: 0   Years of education: Not on file   Highest education level: Bachelor's degree (e.g., BA, AB, BS)  Occupational History   Not on file  Tobacco Use   Smoking status: Never   Smokeless tobacco: Never  Vaping Use   Vaping status: Never Used  Substance and Sexual Activity   Alcohol use: No    Alcohol/week: 0.0 standard drinks of alcohol   Drug use: No   Sexual activity: Never  Other Topics Concern   Not on file  Social History Narrative   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    She lives with both parents.   Right handed      Social Determinants of Health   Financial Resource Strain: Low Risk   (06/23/2020)   Received from [facility]   Overall Financial Resource Strain (CARDIA)    Difficulty of Paying Living Expenses: Not very hard  Food Insecurity: No Food Insecurity (06/23/2020)   Received from [facility] [employer] [facility]   Hunger Vital Sign    Worried About Running Out of Food in the Last Year: Never true    Ran Out of Food in the Last Year: Never true  Transportation Needs: No Transportation Needs (06/23/2020)   Received from [facility]   PRAPARE - Administrator, Civil Service (Medical): No    Lack of Transportation (Non-Medical): No  Physical Activity: Inactive (06/23/2020)   Received from [facility]   Exercise Vital Sign    Days of Exercise per Week: 0 days    Minutes of Exercise per Session: 0 min  Stress: Stress Concern Present (06/23/2020)   Received from [facility]   [employer] of Occupational Health - Occupational Stress Questionnaire    Feeling of Stress : Rather much  Social Connections: Unknown (06/23/2020)   Received from [facility]   Social Connection and Isolation Panel [NHANES]    Frequency of Communication with Friends and Family: Patient declined    Frequency of Social Gatherings with Friends and Family: Patient declined    Attends Religious Services: Patient declined    Database administrator or Organizations: Patient declined    Attends Engineer, structural: Patient declined    Marital Status: Patient declined    Allergies: No Known Allergies  Metabolic Disorder Labs: Lab Results  Component Value Date   HGBA1C 5.4 06/06/2016   MPG 108 06/06/2016   MPG 103 01/10/2016   No results found for: "PROLACTIN" Lab Results  Component Value Date   CHOL 114 (L) 05/24/2015   TRIG 69 05/21/2014   HDL 50 05/24/2015   CHOLHDL 2.6 05/21/2014   VLDL 14 05/21/2014   LDLCALC 52 05/21/2014   Lab Results  Component Value Date   TSH 1.62 12/04/2018   TSH 2.12  06/06/2016    Therapeutic Level Labs: No results found for: "LITHIUM" No results found for: "VALPROATE" No results found for: "CBMZ"  Current Medications: Current Outpatient Medications  Medication Sig Dispense Refill   Baclofen 5 MG TABS Take 5 mg by mouth 3 (three) times daily. (Patient taking differently: Take 5 mg by mouth 3 (three) times daily. Takes as needed) 90 tablet 3   buPROPion (WELLBUTRIN XL) 300 MG 24 hr tablet Take 300 mg by mouth daily.  1   clonazePAM (KLONOPIN) 0.5 MG tablet TAKE 1 2 TO 1 (ONE HALF TO ONE) TABLET BY MOUTH TWICE DAILY AS NEEDED FOR ANXIETY AND PANIC ATTACK     desvenlafaxine (PRISTIQ) 50 MG 24 hr tablet TAKE 1 TABLET BY MOUTH ONCE DAILY FOR ANXIETY     dicyclomine (BENTYL) 10 MG capsule Take 1 capsule (10 mg total) by mouth 4 (four) times daily -  before meals and at bedtime. 40 capsule 0   Vitamin D, Ergocalciferol, (DRISDOL) 1.25 MG (50000 UNIT) CAPS capsule Take 1 capsule (50,000 Units total) by mouth every 7 (seven) days. 5 capsule 11   No current facility-administered medications for this visit.     Musculoskeletal: Strength & Muscle Tone: within normal limits Gait & Station: normal Patient leans: N/A  Psychiatric Specialty Exam: Review of Systems  Respiratory:  Negative for shortness of breath.   Cardiovascular:  Negative for chest pain.  Gastrointestinal:  Negative for abdominal pain, constipation, diarrhea, nausea and vomiting.  Neurological:  Negative for dizziness, weakness and headaches.  Psychiatric/Behavioral:  Positive for dysphoric mood. Negative for hallucinations, sleep disturbance and suicidal ideas. The patient is nervous/anxious.     There were no vitals taken for this visit.There is no height or weight on file to calculate BMI.  General Appearance: Casual and Fairly Groomed  Eye Contact:  Good  Speech:  Clear and Coherent and Normal Rate  Volume:  Normal  Mood:  Anxious and Dysphoric  Affect:  Congruent  Thought Process:   Coherent and Goal Directed  Orientation:  Full (Time, Place, and Person)  Thought Content: WDL and Logical   Suicidal Thoughts:  No  Homicidal Thoughts:  No  Memory:  Immediate;   Good Recent;   Good  Judgement:  Good  Insight:  Good  Psychomotor Activity:  Normal  Concentration:  Concentration: Good and Attention Span: Good  Recall:  Good  Fund of Knowledge: Good  Language: Good  Akathisia:  Negative  Handed:  Right  AIMS (if indicated): not done  Assets:  Communication Skills Desire for Improvement Housing Physical Health Resilience Social Support Vocational/Educational  ADL's:  Intact  Cognition: WNL  Sleep:  Fair   Screenings: PHQ2-9    Flowsheet Row Office Visit from 10/21/2019 in [facility] Office Visit from 08/03/2019 in [facility] Office Visit from 06/08/2019 in [facility] Office Visit from 05/20/2019 in [facility] Office Visit from 04/22/2019 in [facility]  PHQ-2 Total Score 0 4 6 2 3   PHQ-9 Total Score 2 12 21 14 8         Assessment and Plan:  Aarohi Eichorn is a 27 yr old female who presents for TMS Consult.  PPHx is significant for MDD, GAD, and PTSD, and 1 Prior Suicide Attempt (OD- age 24), and no history of Self Injurious Behavior or Psychiatric Hospitalizations.   Given her significant depression and anxiety and failed medication trials- Cymbalta, Zoloft, Prozac, Lexapro, Celexa, and Abilify along with her current regiment not resolving her symptoms- Wellbutrin 450 mg and Pristiq 50 mg.  As she has had a positive response to TMS in the past she should respond relatively well again and is a good candidate.  We will move forward with scheduling the MT appointment.   Collaboration of Care: Collaboration of Care:   Patient/Guardian was advised Release of Information must be obtained prior to any record release in order to  collaborate their care with an outside provider. Patient/Guardian was advised if they have not already done so to contact the registration department to sign all necessary forms in order for [place] to release information regarding their care.   Consent: Patient/Guardian gives verbal consent for treatment and assignment of benefits for services provided during this visit. Patient/Guardian expressed understanding and agreed to proceed.    Lauro Franklin, MD 12/10/2022, 2:50 PM

## 2022-12-11 ENCOUNTER — Telehealth (HOSPITAL_COMMUNITY): Payer: Self-pay

## 2022-12-11 NOTE — Telephone Encounter (Signed)
TMS C: Placed outgoing call to patient. No answer, left voicemail confirming prior authorization process. Coordinator confirmed that consult went well, and that all paperwork had been faxed to [facility]. Coordinator stated that Monia Pouch may take up to 15 business days before responding, but response may come in sooner than that. Coordinator alerted patient that they would reach back out once response had been given.

## 2022-12-11 NOTE — Telephone Encounter (Signed)
TMS C: Placed outgoing phone call to Monia Pouch to request prior authorization for TMS treatment.  Spoke with Representative Jee B. Ref #[id number]. Initial confusion regarding place of service. Coordinator provided information for [facility], Address [address] [place], [place] #[id number]. Coordinator emphasized treatments would take place at [facility], [address], [place] #[id number].  Prior auth created, [id number]. Rep informed Coordinator of 15 business day turn around.

## 2022-12-12 ENCOUNTER — Telehealth (HOSPITAL_COMMUNITY): Payer: Self-pay

## 2022-12-12 DIAGNOSIS — F411 Generalized anxiety disorder: Secondary | ICD-10-CM | POA: Diagnosis not present

## 2022-12-12 DIAGNOSIS — F4310 Post-traumatic stress disorder, unspecified: Secondary | ICD-10-CM | POA: Diagnosis not present

## 2022-12-12 DIAGNOSIS — F332 Major depressive disorder, recurrent severe without psychotic features: Secondary | ICD-10-CM | POA: Diagnosis not present

## 2022-12-12 NOTE — Telephone Encounter (Signed)
TMS C: Placed outgoing call to patient's insurance (A[employer])to confirm if prior authorization forms have been received.   Spoke with Rep: Alen C at 10:30am. Representative confirmed that Prior Auth was not received and instead needed to be faxed to [phone]. Coordinator confirmed number before concluding call.

## 2022-12-13 ENCOUNTER — Telehealth (HOSPITAL_COMMUNITY): Payer: Self-pay

## 2022-12-13 NOTE — Telephone Encounter (Signed)
TMS C: Placed outgoing call to patient's insurance Administrator) to confirm if prior auth was received in full (d/t jammed fax machine while sending).   Spoke with rep: Judith Blonder. Reference #[id number]. Representative confirmed all 37 pages were received and are currently under review/processing. Rep provided Berkley Harvey #[id number] before concluding call. Wait await response before updating patient.

## 2022-12-14 ENCOUNTER — Telehealth (HOSPITAL_COMMUNITY): Payer: Self-pay

## 2022-12-14 NOTE — Telephone Encounter (Signed)
TMS C: Received incoming call from [place], Community education officer.   Spoke with case Production designer, theatre/television/film, Serene. Nurse manager informed Coordinator of prior authorization approval Berkley Harvey #[id number]) from 12/18/22 to 06/17/22. Coordinator will inform patient of approval and contact TMS providers on 12/17/15 to schedule initial mapping appointment.

## 2022-12-17 ENCOUNTER — Telehealth (HOSPITAL_COMMUNITY): Payer: Self-pay

## 2022-12-17 NOTE — Telephone Encounter (Signed)
TMS C: Placed outgoing call to patient to inform them of prior auth approval [id number]). Coordinator alerted patient that medical providers have been contacted to gauge availability for scheduling initial mapping appointment. Coordinator confirmed patient's best availability [facility] anytime), and reassured patient that Coordinator will contact them for confirm appt before placing them on schedule. Patient agreed and will await response.

## 2022-12-21 ENCOUNTER — Telehealth (HOSPITAL_COMMUNITY): Payer: Self-pay

## 2022-12-21 NOTE — Telephone Encounter (Signed)
TMS C: Placed outgoing call to pt. No answer, left vm apologizing and thanking pt for their patience during scheduling process. Coordinator relayed that Providers are available for initial mapping appt on Tuesday (12/25/22) either at 1pm or at 2pm. Coordinator let pt know they would be out of office soon, but pt could call office phone and leave vm if time does not work. Coordinator concluded call thanking pt again and assuring they would do all they could on Monday am.

## 2022-12-24 ENCOUNTER — Telehealth (HOSPITAL_COMMUNITY): Payer: Self-pay

## 2022-12-24 NOTE — Telephone Encounter (Signed)
TMS C: Received incoming call/vm over the weekend from pt confirming their initial mapping appt for Tuesday (12/25/22) at 2pm. Coordinator will schedule and confirm with providers.

## 2022-12-25 ENCOUNTER — Other Ambulatory Visit (HOSPITAL_COMMUNITY): Payer: 59 | Attending: Psychiatry

## 2022-12-25 ENCOUNTER — Encounter (HOSPITAL_COMMUNITY): Payer: Self-pay

## 2022-12-25 DIAGNOSIS — F329 Major depressive disorder, single episode, unspecified: Secondary | ICD-10-CM

## 2022-12-25 DIAGNOSIS — F339 Major depressive disorder, recurrent, unspecified: Secondary | ICD-10-CM | POA: Insufficient documentation

## 2022-12-25 DIAGNOSIS — F332 Major depressive disorder, recurrent severe without psychotic features: Secondary | ICD-10-CM

## 2022-12-25 NOTE — Progress Notes (Signed)
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 Prior to treatment, Pt signed an informed consent agreement for TMS treatment. Pt then completed a PHQ-9 assessment and scored a 16 (Mod Severe depression). Pt's treatment area was found by applying single pulses to their left motor cortex, hunting along the anterior/posterior plane and along the superior oblique angle until the best motor response was elicited from pt's right thumb. Pt reported increased anxiety and a history of a slight tremor in their right hand to [employer] tech, attending provider, and resident provider during cortical mapping. Attending provider gave guidance to resident for when to emit single pulses. The pt's best response was observed at 16.1 cm A/P and 35 degrees SOA, with a coil angle of +5 degrees. Pt's motor threshold was calculated using [employer]'s proprietary MT assist algorithm, which produced a calculated motor threshold of 1.29 SMT. Per these findings, pt's treatment parameters are as follows: A/P = 16.1 cm, SOA = 35 degrees, Coil angle = +5 degrees, Motor Threshold = 1.29 SMT With these parameters, the pt will receive 36 sessions  of TMS according to the following protocol: 3000 pulses per session, with stimulation in bursts of pulses lasting 4 seconds at a frequency of 10 Hz, separated by 11 seconds of rest. After determining pt's treatment parameters, coil was moved to treatment location and the first burst of pulses was applied at a reduced power of 70%. Pt reported no complaints or discomfort. Pt tolerated tx well and departed from session without issue.

## 2022-12-26 ENCOUNTER — Telehealth (HOSPITAL_COMMUNITY): Payer: Self-pay

## 2022-12-26 ENCOUNTER — Other Ambulatory Visit (HOSPITAL_COMMUNITY): Payer: 59 | Attending: Psychiatry

## 2022-12-26 VITALS — BP 100/70 | HR 84 | Temp 97.4°F | Resp 16

## 2022-12-26 DIAGNOSIS — F332 Major depressive disorder, recurrent severe without psychotic features: Secondary | ICD-10-CM | POA: Diagnosis not present

## 2022-12-26 DIAGNOSIS — F339 Major depressive disorder, recurrent, unspecified: Secondary | ICD-10-CM | POA: Insufficient documentation

## 2022-12-26 NOTE — Progress Notes (Signed)
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 TMS tech collected vital signs after pt arrival which read as follows: BP = 100/70, pulse = 84, temperature = 97.4, respiratory rate = 16. Pt's treatment parameters are as follows: A/P = 16.1 cm, SOA = 35 degrees, Coil angle = +5 degrees, Motor Threshold = 1.29 SMT With these parameters, the pt will receive 36 sessions  of TMS according to the following protocol: 3000 pulses per session, with stimulation in bursts of pulses lasting 4 seconds at a frequency of 10 Hz, separated by 11 seconds of rest. Patient reported to TMS tech that OTC medication (tylenol) was taken prior to session. TMS tech moved the coil to treatment location and the first burst of pulses was applied at a reduced power of 70%. Pt reported slight discomfort/sensory pain behind the left eye. TMS tech made slight adjustments to coil angle and AP bar to reduce discomfort and remain away from motor strip. Patient then reported sensory discomfort behind the right eye and eyebrow; pt stated that pain was not 'unbearable' and could be tolerated, but positioning felt different that previous session. TMS tech reverted coil angle back to +5 degrees and kept the adjusted AP bar the same. Patient reported sensory pain was still present but decreased. TMS tech titrated treatment power up to 85% by the end of the session and no further complaints or discomfort was reported. [employer] tech contacted resident provider to inform them of adjustments and to complete request made by pt for a note to be provided to assist with work schedule. Pt departed from session without further issue.

## 2022-12-26 NOTE — Telephone Encounter (Signed)
TMS C: Received vm from pt early on 12/26/22 requesting to come in at 11am for their second TMS tx appt. Pt stated they could come in later if need be. Coordinator placed an outgoing call to pt to confirm that 11am works perfectly and they will be looking forward to seeing the pt soon. Coordinator scheduled pt appt on Epic before concluding call.

## 2022-12-27 ENCOUNTER — Other Ambulatory Visit (HOSPITAL_COMMUNITY): Payer: 59 | Attending: Psychiatry

## 2022-12-27 DIAGNOSIS — F332 Major depressive disorder, recurrent severe without psychotic features: Secondary | ICD-10-CM | POA: Insufficient documentation

## 2022-12-27 NOTE — Progress Notes (Signed)
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 Pt's treatment parameters are as follows: A/P = 16.1 cm, SOA = 35 degrees, Coil angle = +5 degrees, Motor Threshold = 1.29 SMT With these parameters, the pt will receive 36 sessions of TMS according to the following protocol: 3000 pulses per session, with stimulation in bursts of pulses lasting 4 seconds at a frequency of 10 Hz, separated by 11 seconds of rest. Patient and TMS tech spoke about PHI, appointment times, and work accommodations. Patient was able to receive a note from resident provider to give to their employer (excuses them from work for an uninterrupted 30 minutes to attend treatment). TMS tech moved the coil to treatment location and the first burst of pulses was applied at a reduced power of 80%. Pt reported slight discomfort but no sensory pain. After adding an additional pad to the back of patient's head, discomfort was reduced. TMS tech slowly titrated treatment power up to 100%, but titrated power back down for last 5 pulse trains to 95% to assist with toleration. No further complaints or discomfort was reported. [employer] tech and patient made a plan to remain in contact in regards to appointment time for following day, due to [facility]. Pt departed from session without further issue.

## 2022-12-31 ENCOUNTER — Other Ambulatory Visit (HOSPITAL_COMMUNITY): Payer: 59 | Attending: Psychiatry

## 2022-12-31 ENCOUNTER — Telehealth (HOSPITAL_COMMUNITY): Payer: Self-pay

## 2022-12-31 VITALS — BP 102/72 | HR 86 | Temp 98.3°F | Resp 20

## 2022-12-31 DIAGNOSIS — F339 Major depressive disorder, recurrent, unspecified: Secondary | ICD-10-CM | POA: Insufficient documentation

## 2022-12-31 DIAGNOSIS — F329 Major depressive disorder, single episode, unspecified: Secondary | ICD-10-CM

## 2022-12-31 NOTE — Progress Notes (Signed)
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 Pt's treatment parameters are as follows: A/P = 16.1 cm, SOA = 35 degrees, Coil angle = +5 degrees, Motor Threshold = 1.29 SMT With these parameters, the pt will receive 36 sessions of TMS according to the following protocol: 3000 pulses per session, with stimulation in bursts of pulses lasting 4 seconds at a frequency of 10 Hz, separated by 11 seconds of rest.   Patient and TMS tech spoke about preferred appointment times to accommodate lunch break/work schedule. Patient stated they would prefer to do 9am on Mondays and Tuesdays, and in the later afternoon Wednesday through Friday (tentatively). TMS Tech had patient fill out the weekly PHQ-9; patient scored a 14. TMS tech collected vital signs which read as the follow: BP = 89/64, Pulse = 86, Temperature = 98.3, Respiratory Rate = 20. TMS tech gauged pt's affect and observed no abnormal symptoms that would indicate low BP.  Patient reported feeling increased anxiety before and during treatment session. [employer] tech began the treatment session and moved the coil to treatment location. The first burst of pulses was applied at a reduced power of 90%. Pt reported slight discomfort but no sensory pain. TMS tech slowly titrated treatment power up to 105% and no further complaints or discomfort was reported by patient. [employer] tech and patient spoke about a potential POTS dx, as patient reported 'random' moments of dizziness, nausea, tinnitus, and sudden sweating. [employer] Tech assured that treatment would not have an effect on BP, but will check with provider for further clarification. [employer] tech suggested remedy of immediately laying down (if able) when symptoms present and following up with PCP. TMS tech checked BP twice at the end of the session; 1) BP = 99/69 (sitting & right arm), pulse =  84. 2) BP = 102/72 (standing & left arm), pulse = 86.  Patient continued to report normal symptoms. [employer] tech and patient made a plan to have treatment begin at 9 am the following day. Pt departed from session without further issue.

## 2022-12-31 NOTE — Telephone Encounter (Signed)
TMS C: Pt sent incoming email to discuss TMS tx session time for 12/31/22. Pt requested either 9 am or between 11 or 12p.  Coordinator responded via outgoing email to say that 9 am would not work due to additional meetings, but that 11 am or 12 p would do just fine. Pt decided on 12 p for appt time.

## 2023-01-01 ENCOUNTER — Other Ambulatory Visit (HOSPITAL_COMMUNITY): Payer: 59 | Attending: Psychiatry

## 2023-01-01 ENCOUNTER — Telehealth (HOSPITAL_COMMUNITY): Payer: Self-pay

## 2023-01-01 VITALS — BP 99/62 | HR 87 | Temp 98.0°F | Resp 16

## 2023-01-01 DIAGNOSIS — F4310 Post-traumatic stress disorder, unspecified: Secondary | ICD-10-CM | POA: Insufficient documentation

## 2023-01-01 DIAGNOSIS — F329 Major depressive disorder, single episode, unspecified: Secondary | ICD-10-CM

## 2023-01-01 DIAGNOSIS — F419 Anxiety disorder, unspecified: Secondary | ICD-10-CM | POA: Diagnosis not present

## 2023-01-01 DIAGNOSIS — F331 Major depressive disorder, recurrent, moderate: Secondary | ICD-10-CM | POA: Diagnosis not present

## 2023-01-01 DIAGNOSIS — F411 Generalized anxiety disorder: Secondary | ICD-10-CM | POA: Diagnosis not present

## 2023-01-01 DIAGNOSIS — F330 Major depressive disorder, recurrent, mild: Secondary | ICD-10-CM | POA: Diagnosis not present

## 2023-01-01 NOTE — Progress Notes (Signed)
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 Pt's treatment parameters are as follows: A/P = 15.6 cm, SOA = 35 degrees, Coil angle = +5 degrees, Motor Threshold = 1.29 SMT With these parameters, the pt will receive 36 sessions of TMS according to the following protocol: 3000 pulses per session, with stimulation in bursts of pulses lasting 4 seconds at a frequency of 10 Hz, separated by 11 seconds of rest.    Patient and TMS tech spoke about preferred treatment times for the rest of the business week, along with a makeup session to occur on Saturday while assessing vital signs. TMS tech collected vital signs which read as the follow: BP = 98/65, Pulse = 94, Temperature = 97.3 Respiratory Rate = 16. TMS tech checked the patient's BP, pulse, and temperature twice (sitting then standing) to ensure no abnormalities were presented. Second set of vital read as follows: BP = 99/62, pulse = 87, temp = 98.0. [employer] tech began the treatment session and moved the coil to treatment location. Patient stated that pulses at 105% from the previous session felt "intense," but bearable as no sensory pain was felt. TMS Tech assured pt to let them know if intensity felt intolerable and they could adjust as needed. The first burst of pulses was applied at a reduced power of 90%. No discomfort was reported. TMS tech slowly titrated treatment power up to 105% and patient tolerated treatment well. Patient continued to report normal symptoms at the end of the session. Patient requested additional TMS information to self-educate on brain function/process, TMS tech will email pt all relevant info. Pt departed from session without further issue.

## 2023-01-01 NOTE — Telephone Encounter (Signed)
TMS C: Sent pt outgoing email - Pt requested additional TMS info to further self-education about mechanics, screening/clinical process, brain function, etc. Coordinator agreed and sent email after concluding session with relevant info - no proprietary info included.

## 2023-01-02 ENCOUNTER — Other Ambulatory Visit (HOSPITAL_BASED_OUTPATIENT_CLINIC_OR_DEPARTMENT_OTHER): Payer: 59

## 2023-01-02 DIAGNOSIS — F331 Major depressive disorder, recurrent, moderate: Secondary | ICD-10-CM | POA: Diagnosis not present

## 2023-01-02 DIAGNOSIS — F3289 Other specified depressive episodes: Secondary | ICD-10-CM | POA: Insufficient documentation

## 2023-01-02 DIAGNOSIS — F329 Major depressive disorder, single episode, unspecified: Secondary | ICD-10-CM | POA: Diagnosis not present

## 2023-01-02 DIAGNOSIS — F4310 Post-traumatic stress disorder, unspecified: Secondary | ICD-10-CM | POA: Diagnosis not present

## 2023-01-02 DIAGNOSIS — F419 Anxiety disorder, unspecified: Secondary | ICD-10-CM | POA: Diagnosis not present

## 2023-01-02 NOTE — Progress Notes (Signed)
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 Pt's treatment parameters are as follows: A/P = 15.6 cm, SOA = 35 degrees, Coil angle = +5 degrees, Motor Threshold = 1.29 SMT With these parameters, the pt will receive 36 sessions of TMS according to the following protocol: 3000 pulses per session, with stimulation in bursts of pulses lasting 4 seconds at a frequency of 10 Hz, separated by 11 seconds of rest.    Patient and TMS tech confirmed treatment times for the rest of the week, along with the makeup session to occur on Saturday. Patient requested a new/updated note from resident provider to excuse them for 30-60 minutes to accommodate work schedule and increased duration of chair alignment. [employer] Tech agreed and said that updated note should be given by the end of the week. TMS tech then began the treatment session and moved the coil to treatment location.The first burst of pulses was applied at a reduced power of 95%. No discomfort was reported by the patient. TMS tech slowly titrated treatment power up to 110% and patient tolerated treatment well. TMS tech titrated power down to 105% for final two pulse trains to increase toleration/acclimation. Patient continued to report normal symptoms at the end of the session. Pt then departed from session without issue.

## 2023-01-03 ENCOUNTER — Other Ambulatory Visit (HOSPITAL_BASED_OUTPATIENT_CLINIC_OR_DEPARTMENT_OTHER): Payer: 59

## 2023-01-03 DIAGNOSIS — F331 Major depressive disorder, recurrent, moderate: Secondary | ICD-10-CM | POA: Diagnosis not present

## 2023-01-03 DIAGNOSIS — F4310 Post-traumatic stress disorder, unspecified: Secondary | ICD-10-CM | POA: Diagnosis not present

## 2023-01-03 DIAGNOSIS — F419 Anxiety disorder, unspecified: Secondary | ICD-10-CM | POA: Diagnosis not present

## 2023-01-03 DIAGNOSIS — F329 Major depressive disorder, single episode, unspecified: Secondary | ICD-10-CM

## 2023-01-03 NOTE — Progress Notes (Signed)
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 Pt's treatment parameters are as follows: A/P = 15.6 cm, SOA = 35 degrees, Coil angle = +5 degrees, Motor Threshold = 1.29 SMT. With these parameters, the pt will receive 36 sessions of TMS according to the following protocol: 3000 pulses per session, with stimulation in bursts of pulses lasting 4 seconds at a frequency of 10 Hz, separated by 11 seconds of rest.    Patient exhibited a decreased affect during the session with minimal small talk occurring. Patient verbally reported they were feeling fine with no concerns to note. TMS tech then began the treatment session and moved the coil to treatment location.The first burst of pulses was applied at a reduced power of 100%. No discomfort was reported by the patient.TMS tech slowly titrated treatment power up to 115% and patient reported "a noticeable difference with intensity of pulses," but no sharp pain was felt behind their eye/eyebrow. Patient tolerated increased tx power well. TMS tech offered to titrate power back down for remaining 5 pulse trains and patient declined. Patient overall is making great strides in their toleration/acclimation. Patient continued to report normal symptoms at the end of the session. Pt then departed from session without issue.

## 2023-01-04 ENCOUNTER — Encounter: Payer: Self-pay | Admitting: Student in an Organized Health Care Education/Training Program

## 2023-01-04 ENCOUNTER — Other Ambulatory Visit (HOSPITAL_COMMUNITY): Payer: 59

## 2023-01-04 DIAGNOSIS — F419 Anxiety disorder, unspecified: Secondary | ICD-10-CM | POA: Diagnosis not present

## 2023-01-04 DIAGNOSIS — F329 Major depressive disorder, single episode, unspecified: Secondary | ICD-10-CM

## 2023-01-04 DIAGNOSIS — F331 Major depressive disorder, recurrent, moderate: Secondary | ICD-10-CM | POA: Diagnosis not present

## 2023-01-04 DIAGNOSIS — F338 Other recurrent depressive disorders: Secondary | ICD-10-CM | POA: Insufficient documentation

## 2023-01-04 DIAGNOSIS — F4310 Post-traumatic stress disorder, unspecified: Secondary | ICD-10-CM | POA: Diagnosis not present

## 2023-01-04 NOTE — Progress Notes (Signed)
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 Pt's treatment parameters are as follows: A/P = 15.6 cm, SOA = 35 degrees, Coil angle = +5 degrees, Motor Threshold = 1.29 SMT. With these parameters, the pt will receive 36 sessions of TMS according to the following protocol: 3000 pulses per session, with stimulation in bursts of pulses lasting 4 seconds at a frequency of 10 Hz, separated by 11 seconds of rest.    Patient arrived and exhibited an increased mood/affect from previous session. Patient and TMS Tech chatted about their current hobbies and physical activity level (moderate to moderately high). TMS tech then began the treatment session and moved the coil to treatment location. The first burst of pulses was applied at a reduced power of 100%. No discomfort was reported by the patient. TMS tech slowly titrated treatment power up to 115% to assist with overall acclimation. No sharp pain or high discomfort was reported by the patient. Patient tolerated increased tx power well. TMS tech offered to titrate power up to 120% for last 2 pulse trains and patient agreed. After concluding the treatment session, Patient stated "oh that wasn't bad" in regard to full treatment power. [employer] Tech confirmed with patient the appointment time for Saturday makeup session, along with session time for Monday. Patient reported no abnormal symptoms before departing from outpatient office.

## 2023-01-05 ENCOUNTER — Other Ambulatory Visit (HOSPITAL_COMMUNITY): Payer: 59

## 2023-01-05 DIAGNOSIS — F419 Anxiety disorder, unspecified: Secondary | ICD-10-CM | POA: Diagnosis not present

## 2023-01-05 DIAGNOSIS — F339 Major depressive disorder, recurrent, unspecified: Secondary | ICD-10-CM | POA: Insufficient documentation

## 2023-01-05 DIAGNOSIS — F4310 Post-traumatic stress disorder, unspecified: Secondary | ICD-10-CM | POA: Diagnosis not present

## 2023-01-05 DIAGNOSIS — F329 Major depressive disorder, single episode, unspecified: Secondary | ICD-10-CM | POA: Diagnosis not present

## 2023-01-05 DIAGNOSIS — F331 Major depressive disorder, recurrent, moderate: Secondary | ICD-10-CM | POA: Diagnosis not present

## 2023-01-05 NOTE — Progress Notes (Signed)
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 Pt's treatment parameters are as follows: A/P = 15.6 cm, SOA = 35 degrees, Coil angle = +5 degrees, Motor Threshold = 1.29 SMT. With these parameters, the pt will receive 36 sessions of TMS according to the following protocol: 3000 pulses per session, with stimulation in bursts of pulses lasting 4 seconds at a frequency of 10 Hz, separated by 11 seconds of rest.    Patient arrived and expressed an increase in anxiety symptoms due to work-related/social issues. [employer] Tech offered different techniques/solutions (running cold water on wrists, holding ice) to reduce physiological symptoms. [employer] Tech provided verbal disclaimer that they are not a licensed counselor/therapist, so all solutions offered are to be taken as suggestions. Pt gave verbal consent to informal advice. TMS tech then began the treatment session and moved the coil to treatment location. TMS tech observed a misalignment with the chair (coil not matching coil angle). Tech reset pt's position back to M1 and realigned the patient again. No further issues were observed. The first burst of pulses was applied at a reduced power of 105%. No discomfort was reported by the patient. TMS tech slowly titrated treatment power up to 120% to assist with overall acclimation. No sharp pain or high discomfort was reported by the patient. Patient tolerated increased tx power well. Patient reported after session that increased anxiety may cause a 'flare-up' with other medical dx's, so FMLA paperwork may be needed to continue treatment and balance work schedule. Patient stated no immediate need for it currently, but they would let TMS Tech and provider know if that changes. Patient departed from treatment with no other concerns or abnormal symptoms.

## 2023-01-07 ENCOUNTER — Other Ambulatory Visit (HOSPITAL_BASED_OUTPATIENT_CLINIC_OR_DEPARTMENT_OTHER): Payer: 59

## 2023-01-07 VITALS — BP 104/80 | HR 94 | Temp 97.8°F | Resp 20

## 2023-01-07 DIAGNOSIS — F3289 Other specified depressive episodes: Secondary | ICD-10-CM | POA: Insufficient documentation

## 2023-01-07 DIAGNOSIS — F4310 Post-traumatic stress disorder, unspecified: Secondary | ICD-10-CM | POA: Diagnosis not present

## 2023-01-07 DIAGNOSIS — F329 Major depressive disorder, single episode, unspecified: Secondary | ICD-10-CM | POA: Diagnosis not present

## 2023-01-07 DIAGNOSIS — F331 Major depressive disorder, recurrent, moderate: Secondary | ICD-10-CM | POA: Diagnosis not present

## 2023-01-07 DIAGNOSIS — F419 Anxiety disorder, unspecified: Secondary | ICD-10-CM | POA: Diagnosis not present

## 2023-01-07 NOTE — Progress Notes (Signed)
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 Pt's treatment parameters are as follows: A/P = 15.6 cm, SOA = 35 degrees, Coil angle = +5 degrees, Motor Threshold = 1.29 SMT. With these parameters, the pt will receive 36 sessions of TMS according to the following protocol: 3000 pulses per session, with stimulation in bursts of pulses lasting 4 seconds at a frequency of 10 Hz, separated by 11 seconds of rest.    Patient arrived and TMS Tech collected vitals and PHQ-9. Patient's vitals read as follows: BP = 104/80, pulse = 94, Temperature = 97.8, Respiratory Rate = 20.  Patient scored a 10 on PHQ-9. Patient did not verbally expressed an increase in anxiety but was quieter during treatment. The first burst of pulses was applied at a reduced power of 105%. No discomfort was reported by the patient. TMS tech slowly titrated treatment power up to 120% to assist with overall acclimation. Patient reported an increase in discomfort behind their eye and TMS Tech observed increased twitching on left hand and foot. TMS Tech moved coil angle to 0 degrees and lowered tx power to 115%. Discomfort was still reported by the patient, and twitching was observed on right hand (pinky and ring finger). TMS Tech moved coil angle back to +5 degrees and adjusted AP bar by one point.  Patient tolerated adjustment but requested treatment power be reduced "a lot, as head is feeling sensitive." TMS Tech reverted treatment power back down to 105% for final pulse train. Patient departed from treatment with no other concerns or abnormal symptoms.

## 2023-01-08 ENCOUNTER — Other Ambulatory Visit (HOSPITAL_COMMUNITY): Payer: 59

## 2023-01-08 DIAGNOSIS — F411 Generalized anxiety disorder: Secondary | ICD-10-CM | POA: Diagnosis not present

## 2023-01-08 DIAGNOSIS — F329 Major depressive disorder, single episode, unspecified: Secondary | ICD-10-CM

## 2023-01-08 DIAGNOSIS — F4310 Post-traumatic stress disorder, unspecified: Secondary | ICD-10-CM | POA: Diagnosis not present

## 2023-01-08 DIAGNOSIS — F331 Major depressive disorder, recurrent, moderate: Secondary | ICD-10-CM | POA: Diagnosis not present

## 2023-01-08 DIAGNOSIS — F330 Major depressive disorder, recurrent, mild: Secondary | ICD-10-CM | POA: Diagnosis not present

## 2023-01-08 DIAGNOSIS — F419 Anxiety disorder, unspecified: Secondary | ICD-10-CM | POA: Diagnosis not present

## 2023-01-08 DIAGNOSIS — F3289 Other specified depressive episodes: Secondary | ICD-10-CM | POA: Insufficient documentation

## 2023-01-08 NOTE — Progress Notes (Signed)
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 Pt's treatment parameters are as follows: A/P = 15.6 cm, SOA = 35 degrees, Coil angle = +5 degrees, Motor Threshold = 1.29 SMT. With these parameters, the pt will receive 36 sessions of TMS according to the following protocol: 3000 pulses per session, with stimulation in bursts of pulses lasting 4 seconds at a frequency of 10 Hz, separated by 11 seconds of rest.    Patient arrived and chatted with TMS Tech before starting the session. Patient exhibited an increased affect with more smiling and laughing observed. When patient was asked if they noticed any internal changes, patient stated they had observed an increase in overall motivation ("I see myself wanting to do more, which is good"). TMS Tech began to align patient up to their individualized measurements and had them sit on the memory foam cushion pad. Pad lifted patient up too high. TMS tech removed pad and had patient sit with full back against the chair. TMS Tech then checked patient's [employer] after moving chair to M2 position, and patient lined up perfectly The first burst of pulses was applied at a reduced power of 105%. No discomfort was reported by the patient. TMS tech titrated treatment power up to 120% sooner than previous session to gauge response. No discomfort or abnormal twitching was reported by the patient. Patient remained at 120% for 8.5 minutes of the session. Patient then departed from treatment with no concerns or discomfort.

## 2023-01-09 ENCOUNTER — Other Ambulatory Visit (HOSPITAL_BASED_OUTPATIENT_CLINIC_OR_DEPARTMENT_OTHER): Payer: 59

## 2023-01-09 DIAGNOSIS — F329 Major depressive disorder, single episode, unspecified: Secondary | ICD-10-CM

## 2023-01-09 DIAGNOSIS — F338 Other recurrent depressive disorders: Secondary | ICD-10-CM | POA: Insufficient documentation

## 2023-01-09 DIAGNOSIS — F331 Major depressive disorder, recurrent, moderate: Secondary | ICD-10-CM | POA: Diagnosis not present

## 2023-01-09 DIAGNOSIS — F4310 Post-traumatic stress disorder, unspecified: Secondary | ICD-10-CM | POA: Diagnosis not present

## 2023-01-09 DIAGNOSIS — F419 Anxiety disorder, unspecified: Secondary | ICD-10-CM | POA: Diagnosis not present

## 2023-01-09 NOTE — Progress Notes (Signed)
  Patient reported to [facility] - Outpatient clinic for session #12 of Repetitive Transcranial Magnetic Stimulation treatment for Major Depressive Disorder/Treatment-Resistant Depression. Pt's treatment parameters are as follows: A/P = 15.6 cm, SOA = 35 degrees, Coil angle = +5 degrees, Motor Threshold = 1.29 SMT. With these parameters, the pt will receive 36 sessions of TMS according to the following protocol: 3000 pulses per session, with stimulation in bursts of pulses lasting 4 seconds at a frequency of 10 Hz, separated by 11 seconds of rest.    Patient arrived and exhibited an decreased affect from prior session. When patient was asked how they were feeling and/or if anxiety was present, patient declined feeling anxious. Patient continued to report an increase in motivation and no changes in sleep. Patient reported success in working out publicly. TMS Tech began to align patient up to their individualized measurements and had them sit on the memory foam cushion pad. Pad lifted patient up too high. TMS tech removed pad and had patient sit with full back against the chair. TMS Tech then checked patient's [employer] after moving chair to M2 position, and patient lined up too high. After changes were made to hair positioning and arms rests, patient lined up for the session. The first burst of pulses was applied at a reduced power of 105%. No discomfort was reported by the patient. TMS tech titrated treatment power up to 115% for majority of the session to gauge overall response. Patient reported feeling twitching in lower extremities. TMS tech made an adjustment to AP bar to reflect 15.5 cm, and twitching was observed intermittently. Twitching was not observed during active pulse trains and could be related to pt's preexisting dx of tremors. No discomfort was reported by the patient. Patient remained at 120% for 2.5 minutes of the session. Patient then departed from treatment with no concerns, abnormal  symptoms, or discomfort.

## 2023-01-10 ENCOUNTER — Other Ambulatory Visit (HOSPITAL_COMMUNITY): Payer: 59

## 2023-01-11 ENCOUNTER — Other Ambulatory Visit (HOSPITAL_COMMUNITY): Payer: 59

## 2023-01-11 DIAGNOSIS — F329 Major depressive disorder, single episode, unspecified: Secondary | ICD-10-CM

## 2023-01-11 DIAGNOSIS — F338 Other recurrent depressive disorders: Secondary | ICD-10-CM | POA: Insufficient documentation

## 2023-01-11 DIAGNOSIS — F4310 Post-traumatic stress disorder, unspecified: Secondary | ICD-10-CM | POA: Diagnosis not present

## 2023-01-11 DIAGNOSIS — F419 Anxiety disorder, unspecified: Secondary | ICD-10-CM | POA: Diagnosis not present

## 2023-01-11 DIAGNOSIS — F331 Major depressive disorder, recurrent, moderate: Secondary | ICD-10-CM | POA: Diagnosis not present

## 2023-01-11 NOTE — Progress Notes (Signed)
  Patient reported to [facility] - Outpatient clinic for session #13 of Repetitive Transcranial Magnetic Stimulation treatment for Major Depressive Disorder/Treatment-Resistant Depression. Pt's treatment parameters are as follows: A/P = 15.6 cm, SOA = 35 degrees, Coil angle = +5 degrees, Motor Threshold = 1.29 SMT. With these parameters, the pt will receive 36 sessions of TMS according to the following protocol: 3000 pulses per session, with stimulation in bursts of pulses lasting 4 seconds at a frequency of 10 Hz, separated by 11 seconds of rest.    Patient arrived and continued to report an increase in motivation and no changes in sleep quality or length. TMS Tech began to align patient up to their individualized measurements and patient lined up for the session with no adjustments needed. The first burst of pulses was applied at a reduced power of 105%. No discomfort or observed twitching in hands/legs were reported by the patient or observed by the TMS Tech. TMS tech titrated treatment power up to 115% for majority of the session, then increased to 120% for final 4 minutes. Patient reported feeling twitching in right hand. TMS tech observed patient's extremities for two pulse trains and observed a significant twitch in the patients right hand - specifically ring and middle finger. TMS Tech titrated power down to 115% at request of the patient and to observe total response. Twitching or discomfort was not observed or reported at 115%. [employer] Tech will speak with provider to see about remaining course of treatment. Patient then departed from treatment with no concerns, abnormal symptoms, or discomfort.

## 2023-01-14 ENCOUNTER — Other Ambulatory Visit (HOSPITAL_COMMUNITY): Payer: 59

## 2023-01-14 VITALS — BP 99/63 | HR 85 | Temp 98.7°F | Resp 20 | Ht 60.0 in

## 2023-01-14 DIAGNOSIS — F419 Anxiety disorder, unspecified: Secondary | ICD-10-CM | POA: Diagnosis not present

## 2023-01-14 DIAGNOSIS — F329 Major depressive disorder, single episode, unspecified: Secondary | ICD-10-CM | POA: Diagnosis not present

## 2023-01-14 DIAGNOSIS — F331 Major depressive disorder, recurrent, moderate: Secondary | ICD-10-CM | POA: Diagnosis not present

## 2023-01-14 DIAGNOSIS — F4310 Post-traumatic stress disorder, unspecified: Secondary | ICD-10-CM | POA: Diagnosis not present

## 2023-01-14 NOTE — Progress Notes (Signed)
  Patient reported to [facility] - Outpatient clinic for session #14 of Repetitive Transcranial Magnetic Stimulation treatment for Major Depressive Disorder/Treatment-Resistant Depression. Pt's treatment parameters are as follows: A/P = 15.6 cm, SOA = 35 degrees, Coil angle = +5 degrees, Motor Threshold = 1.29 SMT. With these parameters, the pt will receive 36 sessions of TMS according to the following protocol: 3000 pulses per session, with stimulation in bursts of pulses lasting 4 seconds at a frequency of 10 Hz, separated by 11 seconds of rest.    Patient arrived and reported they had a good weekend with no internal or external stressors; continued increase in motivation observed. [employer] tech collected weekly vitals and PHQ screening. Pt's vitals were as follows: BP = 99/63, pulse = 85, temp = 98.5, respiratory rate = 20. Patient scored a 4 on the PHQ-9. TMS Tech began to align patient up to their individualized measurements and the first burst of pulses was applied at a reduced power of 110%. Patient reported a 'throbbing' sensation behind their left eye, TMS tech observed an increased twitch that was not present last session. TMS Tech adjusted coil angle to 0 degrees and titrated power back down to 105%. Left eye twitch decreased and patient reported feeling had lessened. Patient then asked if twitching in hands was observed. TMS tech confirmed twitch was intermittent but not significant. Patient did not report feeling anxious upon entering the session, but increasing anxiety was reported during the session. [employer] tech provided handheld tangibles for patient to squeeze to reduce attention toward tx before agreeing that concerns could be psychosomatic. [employer] tech will check in with provider for insight and confirmation. TMS tech titrated treatment power up to 110% for majority of the session, then increased to 115% for final 4 minutes. No concerns, discomfort, or twitching was observed or reported.  Patient then departed from treatment with no further issues.

## 2023-01-15 ENCOUNTER — Other Ambulatory Visit (HOSPITAL_COMMUNITY): Payer: 59

## 2023-01-15 DIAGNOSIS — F329 Major depressive disorder, single episode, unspecified: Secondary | ICD-10-CM | POA: Insufficient documentation

## 2023-01-15 DIAGNOSIS — F331 Major depressive disorder, recurrent, moderate: Secondary | ICD-10-CM | POA: Diagnosis not present

## 2023-01-15 DIAGNOSIS — F419 Anxiety disorder, unspecified: Secondary | ICD-10-CM | POA: Diagnosis not present

## 2023-01-15 DIAGNOSIS — F4310 Post-traumatic stress disorder, unspecified: Secondary | ICD-10-CM | POA: Diagnosis not present

## 2023-01-15 NOTE — Progress Notes (Signed)
  Patient reported to [facility] - Outpatient clinic for session #15 of Repetitive Transcranial Magnetic Stimulation treatment for Major Depressive Disorder/Treatment-Resistant Depression. Pt's treatment parameters are as follows: A/P = 15.6 cm, SOA = 35 degrees, Coil angle = +5 degrees, Motor Threshold = 1.29 SMT. With these parameters, the pt will receive 36 sessions of TMS according to the following protocol: 3000 pulses per session, with stimulation in bursts of pulses lasting 4 seconds at a frequency of 10 Hz, separated by 11 seconds of rest.    Patient arrived with concerns of internal work environment inducing stress. Patient stated they had taken their anti-anxiety medication prior to session, so anxiety is not as high as previous day. TMS Tech began to align patient up to their individualized measurements and confirmed that the provider had given them assurance that pt's intermittent twitch could be flaring up due to increased anxiety levels. [employer] Tech stated plan of keeping patient distraction during treatment while subtly observing responses. Patient consented to plan. TMS Tech applied the first burst of pulses at a reduced power of 105%. [employer] tech provided handheld tangibles for patient to squeeze to reduce attention toward tx while asking patient questions related to hobbies, current TV channels within tx room, and education plans. No discomfort was reported during the treatment session. Patient's right handed twitch was observed, but at a decreased rate and significance. TMS tech titrated treatment power up to 110% for majority of the session, increased to 115% for 3 minutes, then increased to 120% for the remainder of the session. Patient tolerated increases well. Patient then departed from treatment with no further issues

## 2023-01-16 ENCOUNTER — Other Ambulatory Visit (HOSPITAL_BASED_OUTPATIENT_CLINIC_OR_DEPARTMENT_OTHER): Payer: 59

## 2023-01-16 DIAGNOSIS — F329 Major depressive disorder, single episode, unspecified: Secondary | ICD-10-CM

## 2023-01-16 DIAGNOSIS — F419 Anxiety disorder, unspecified: Secondary | ICD-10-CM | POA: Diagnosis not present

## 2023-01-16 DIAGNOSIS — F331 Major depressive disorder, recurrent, moderate: Secondary | ICD-10-CM | POA: Diagnosis not present

## 2023-01-16 DIAGNOSIS — F338 Other recurrent depressive disorders: Secondary | ICD-10-CM | POA: Insufficient documentation

## 2023-01-16 DIAGNOSIS — F4310 Post-traumatic stress disorder, unspecified: Secondary | ICD-10-CM | POA: Diagnosis not present

## 2023-01-16 NOTE — Progress Notes (Signed)
Patient reported to [facility] - Outpatient clinic for session #16 of Repetitive Transcranial Magnetic Stimulation treatment for Major Depressive Disorder/Treatment-Resistant Depression. Pt's treatment parameters are as follows: A/P = 15.6 cm, SOA = 35 degrees, Coil angle = +5 degrees, Motor Threshold = 1.29 SMT. With these parameters, the pt will receive 36 sessions of TMS according to the following protocol: 3000 pulses per session, with stimulation in bursts of pulses lasting 4 seconds at a frequency of 10 Hz, separated by 11 seconds of rest.    Patient arrived with continued concerns of their internal work environment. Patient stated they had taken their anti-anxiety medication prior to session and reported feeling "stressed but not anxious." TMS Tech aligned patient up to their individualized measurements re-confirmed plan of keeping patient distracted during treatment (i.e., watching TV, talking about work stress, Clinical cytogeneticist) while observing responses. Patient consented to plan. TMS Tech applied the first burst of pulses at a reduced power of 110%.  No discomfort was reported during initial titrations. Intermittent twitching was observed on the patient's right hand, but with long intervals between each twitch. Twitching is related to patient's pre-existing condition. TMS tech titrated treatment power up to 115% for majority of the session, then increased to 120% for the last 5.50 minutes. Patient tolerated power increases well, no discomfort was reported during the entire session. Patient departed from treatment with no further issues

## 2023-01-17 ENCOUNTER — Other Ambulatory Visit (HOSPITAL_BASED_OUTPATIENT_CLINIC_OR_DEPARTMENT_OTHER): Payer: 59

## 2023-01-17 ENCOUNTER — Ambulatory Visit: Payer: 59 | Admitting: Family Medicine

## 2023-01-17 DIAGNOSIS — F4310 Post-traumatic stress disorder, unspecified: Secondary | ICD-10-CM | POA: Diagnosis not present

## 2023-01-17 DIAGNOSIS — F329 Major depressive disorder, single episode, unspecified: Secondary | ICD-10-CM | POA: Diagnosis not present

## 2023-01-17 DIAGNOSIS — F338 Other recurrent depressive disorders: Secondary | ICD-10-CM | POA: Insufficient documentation

## 2023-01-17 DIAGNOSIS — F419 Anxiety disorder, unspecified: Secondary | ICD-10-CM | POA: Diagnosis not present

## 2023-01-17 DIAGNOSIS — F331 Major depressive disorder, recurrent, moderate: Secondary | ICD-10-CM | POA: Diagnosis not present

## 2023-01-17 NOTE — Progress Notes (Signed)
Patient reported to [facility] - Outpatient clinic for session #17 of Repetitive Transcranial Magnetic Stimulation treatment for Major Depressive Disorder/Treatment-Resistant Depression. Pt's treatment parameters are as follows: A/P = 15.6 cm, SOA = 35 degrees, Coil angle = +5 degrees, Motor Threshold = 1.29 SMT. With these parameters, the pt will receive 36 sessions of TMS according to the following protocol: 3000 pulses per session, with stimulation in bursts of pulses lasting 4 seconds at a frequency of 10 Hz, separated by 11 seconds of rest.    Patient arrived and exhibited an appropriate affect while reporting they were ina "good mood and having a good day." TMS Tech aligned patient up to their individualized measurements re-confirmed plan of keeping patient distracted during treatment (i.e., watching TV, talking about work stress, Clinical cytogeneticist) while observing responses. Patient consented to plan. TMS Tech applied the first burst of pulses at a reduced power of 110% for the first 3.5 minutes. No discomfort was reported during initial titrations. No twitching of the patient's right hand or lower extremities were observed or reported during the session.  TMS tech titrated treatment power up to 115% for 6 minutes, then increased to full treatment power of 120% for the last 8.5 minutes. Patient tolerated power increases well with no discomfort. Patient verbally confirmed that continued distractions assist in keeping anxiety levels low and tremors to a minimum. Patient departed from treatment with no concerns.

## 2023-01-18 ENCOUNTER — Other Ambulatory Visit (HOSPITAL_COMMUNITY): Payer: 59

## 2023-01-18 DIAGNOSIS — F331 Major depressive disorder, recurrent, moderate: Secondary | ICD-10-CM | POA: Diagnosis not present

## 2023-01-18 DIAGNOSIS — F4310 Post-traumatic stress disorder, unspecified: Secondary | ICD-10-CM | POA: Diagnosis not present

## 2023-01-18 DIAGNOSIS — F338 Other recurrent depressive disorders: Secondary | ICD-10-CM | POA: Insufficient documentation

## 2023-01-18 DIAGNOSIS — F419 Anxiety disorder, unspecified: Secondary | ICD-10-CM | POA: Diagnosis not present

## 2023-01-18 DIAGNOSIS — F329 Major depressive disorder, single episode, unspecified: Secondary | ICD-10-CM | POA: Diagnosis not present

## 2023-01-18 NOTE — Progress Notes (Signed)
Patient reported to [facility] - Outpatient clinic for session #18 of Repetitive Transcranial Magnetic Stimulation treatment for Major Depressive Disorder/Treatment-Resistant Depression. Pt's treatment parameters are as follows: A/P = 15.6 cm, SOA = 35 degrees, Coil angle = +5 degrees, Motor Threshold = 1.29 SMT. With these parameters, the pt will receive 36 sessions of TMS according to the following protocol: 3000 pulses per session, with stimulation in bursts of pulses lasting 4 seconds at a frequency of 10 Hz, separated by 11 seconds of rest.    Patient arrived and reported a good, stable/happy mood while exhibiting an appropriate affect. Patient stated "I'm ready to go do more things, have more life experiences while I'm not in school." TMS Tech aligned patient up to their individualized measurements re-confirmed plan of keeping patient distracted during treatment (i.e., watching TV, talking about work stress, Clinical cytogeneticist) while observing responses. Patient consented to plan. TMS Tech applied the first burst of pulses at a reduced power of 115% for half of the treatment session. No discomfort was reported. No twitching of the patient's right hand or lower extremities were observed or reported during the session. TMS tech then titrated treatment power up to 120% for the second half of the session. Patient tolerated power increases well with no discomfort reported. Patient chatted with [employer] tech about favorite music, TV shows, and plans for the weekend. Patient departed from treatment with no complaints or concerns.

## 2023-01-21 ENCOUNTER — Other Ambulatory Visit (HOSPITAL_COMMUNITY): Payer: 59

## 2023-01-21 VITALS — BP 102/72 | HR 96 | Temp 98.8°F | Resp 16

## 2023-01-21 DIAGNOSIS — F329 Major depressive disorder, single episode, unspecified: Secondary | ICD-10-CM | POA: Diagnosis not present

## 2023-01-21 DIAGNOSIS — F419 Anxiety disorder, unspecified: Secondary | ICD-10-CM | POA: Diagnosis not present

## 2023-01-21 DIAGNOSIS — F4310 Post-traumatic stress disorder, unspecified: Secondary | ICD-10-CM | POA: Diagnosis not present

## 2023-01-21 DIAGNOSIS — F331 Major depressive disorder, recurrent, moderate: Secondary | ICD-10-CM | POA: Diagnosis not present

## 2023-01-21 NOTE — Progress Notes (Signed)
  Patient reported to [facility] - Outpatient clinic for session #19 of Repetitive Transcranial Magnetic Stimulation treatment for Major Depressive Disorder/Treatment-Resistant Depression. Pt's treatment parameters are as follows: A/P = 15.6 cm, SOA = 35 degrees, Coil angle = +5 degrees, Motor Threshold = 1.29 SMT. With these parameters, the pt will receive 36 sessions of TMS according to the following protocol: 3000 pulses per session, with stimulation in bursts of pulses lasting 4 seconds at a frequency of 10 Hz, separated by 11 seconds of rest.    Patient arrived and reported they had worked all weekend, but was still able to rest on Sunday; continued increase in motivation still observed. [employer] tech collected weekly vitals and PHQ screening. Pt's vitals were as follows: BP = 102/72, pulse = 96, temp = 98.8, respiratory rate = 16. Patient scored a 4 on the PHQ-9. [employer] Tech aligned patient up to their individualized measurements and re-confirmed plan of keeping patient distracted during treatment (i.e., watching TV, talking about work stress, Clinical cytogeneticist) while observing responses. Patient consented to plan. TMS Tech applied the first burst of pulses at a reduced power of 115% for half of the treatment session. No discomfort was reported. No twitching of the patient's right hand or lower extremities were observed or reported during the session. TMS tech then titrated treatment power up to 120% for the second half of the session. Patient tolerated power increases well with no discomfort reported. Patient chatted with TMS tech about their place of work while listening to preferred music selection. Patient departed from treatment with no complaints or concerns.

## 2023-01-22 ENCOUNTER — Other Ambulatory Visit (HOSPITAL_COMMUNITY): Payer: 59

## 2023-01-22 DIAGNOSIS — F329 Major depressive disorder, single episode, unspecified: Secondary | ICD-10-CM | POA: Diagnosis not present

## 2023-01-22 DIAGNOSIS — F419 Anxiety disorder, unspecified: Secondary | ICD-10-CM | POA: Diagnosis not present

## 2023-01-22 DIAGNOSIS — F3289 Other specified depressive episodes: Secondary | ICD-10-CM | POA: Insufficient documentation

## 2023-01-22 DIAGNOSIS — F4310 Post-traumatic stress disorder, unspecified: Secondary | ICD-10-CM | POA: Diagnosis not present

## 2023-01-22 DIAGNOSIS — F331 Major depressive disorder, recurrent, moderate: Secondary | ICD-10-CM | POA: Diagnosis not present

## 2023-01-22 NOTE — Progress Notes (Signed)
  Patient reported to [facility] - Outpatient clinic for session #20 of Repetitive Transcranial Magnetic Stimulation treatment for Major Depressive Disorder/Treatment-Resistant Depression. Pt's treatment parameters are as follows: A/P = 15.6 cm, SOA = 35 degrees, Coil angle = +5 degrees, Motor Threshold = 1.29 SMT. With these parameters, the pt will receive 36 sessions of TMS according to the following protocol: 3000 pulses per session, with stimulation in bursts of pulses lasting 4 seconds at a frequency of 10 Hz, separated by 11 seconds of rest.    Patient arrived and exhibited a quiet affect, but still appropriate for treatment. [employer] Tech aligned patient up to their individualized measurements and re-confirmed plan of keeping patient distracted during treatment (i.e., watching TV, talking about work stress, Clinical cytogeneticist) while observing responses. Patient consented to plan. TMS Tech experienced difficulty in aligning patient, as coil was sitting too far on patient's left eye. [employer] Tech attempted troubleshooting with patient still in the chair before reverting chair back to the M1 position. Patient stood up out of the chair and [employer] began process again. No further difficulty was experienced in aligning patient to measurements. TMS Tech applied the first burst of pulses at a reduced power of 100% for 4 minutes of the session to gauge response. No discomfort was reported by the patient. TMS Tech then increased power to 115%. No discomfort was reported. No twitching of the patient's right hand or lower extremities were observed or reported. TMS tech then titrated treatment power up to 120% for the final 5 minutes of the session. Patient tolerated power increases well while continuing to chat with TMS Tech. Patient departed from treatment with no complaints or concerns.

## 2023-01-23 ENCOUNTER — Other Ambulatory Visit (HOSPITAL_COMMUNITY): Payer: 59

## 2023-01-24 ENCOUNTER — Other Ambulatory Visit (HOSPITAL_COMMUNITY): Payer: 59

## 2023-01-25 ENCOUNTER — Telehealth (HOSPITAL_COMMUNITY): Payer: Self-pay

## 2023-01-25 ENCOUNTER — Other Ambulatory Visit (HOSPITAL_COMMUNITY): Payer: 59

## 2023-01-25 DIAGNOSIS — F329 Major depressive disorder, single episode, unspecified: Secondary | ICD-10-CM | POA: Diagnosis not present

## 2023-01-25 DIAGNOSIS — F419 Anxiety disorder, unspecified: Secondary | ICD-10-CM | POA: Diagnosis not present

## 2023-01-25 DIAGNOSIS — F338 Other recurrent depressive disorders: Secondary | ICD-10-CM | POA: Insufficient documentation

## 2023-01-25 DIAGNOSIS — F4310 Post-traumatic stress disorder, unspecified: Secondary | ICD-10-CM | POA: Diagnosis not present

## 2023-01-25 DIAGNOSIS — F331 Major depressive disorder, recurrent, moderate: Secondary | ICD-10-CM | POA: Diagnosis not present

## 2023-01-25 NOTE — Telephone Encounter (Signed)
TMS C: Placed outgoing call to pt to confirm schedule for today's (01/25/23) session, no answer - left vm. Patient called back right after vm concluded and confirmed a 12:00pm time slot. Coordinator thanked pt for their patience and said they would see them soon.

## 2023-01-25 NOTE — Telephone Encounter (Signed)
TMS C: Patient called to alert Coordinator that they were running behind, but should arrive to session around 12:15p. Coordinator stated that was fine and they would be here waiting.

## 2023-01-25 NOTE — Progress Notes (Signed)
Patient reported to [facility] - Outpatient clinic for session #21 of Repetitive Transcranial Magnetic Stimulation treatment for Major Depressive Disorder/Treatment-Resistant Depression. Pt's treatment parameters are as follows: A/P = 15.6 cm, SOA = 35 degrees, Coil angle = +5 degrees, Motor Threshold = 1.29 SMT. With these parameters, the pt will receive 36 sessions of TMS according to the following protocol: 3000 pulses per session, with stimulation in bursts of pulses lasting 4 seconds at a frequency of 10 Hz, separated by 11 seconds of rest.    Patient arrived and exhibited a good mood and appropriate affect for treatment. [employer] Tech aligned patient up to their individualized measurements and re-confirmed plan of keeping patient distracted during treatment (i.e., watching TV, talking about work stress, Clinical cytogeneticist) while observing responses. Patient consented to plan. No difficulty in aligning patient to individualized measurements was experienced or observed during the session. TMS Tech applied the first burst of pulses at a reduced power of 105% for 3 minutes of the session to gauge response, due to missing the last two days of treatment. No discomfort was reported by the patient. TMS Tech then increased power to 115%. No discomfort was reported. No twitching of the patient's right hand or lower extremities were observed or reported. TMS tech then titrated treatment power up to 120% for the final 4 minutes of the session. Patient tolerated power increases well while continuing to chat with TMS Tech. [employer] Tech and patient discussed scheduling for the upcoming week while confirming plan to return to power titrations of 115%-120% only. Patient departed from treatment with no complaints or concerns.

## 2023-01-28 ENCOUNTER — Other Ambulatory Visit (HOSPITAL_BASED_OUTPATIENT_CLINIC_OR_DEPARTMENT_OTHER): Payer: 59

## 2023-01-28 VITALS — BP 106/68 | HR 74 | Temp 97.6°F | Resp 20

## 2023-01-28 DIAGNOSIS — F329 Major depressive disorder, single episode, unspecified: Secondary | ICD-10-CM | POA: Diagnosis not present

## 2023-01-28 DIAGNOSIS — F331 Major depressive disorder, recurrent, moderate: Secondary | ICD-10-CM | POA: Diagnosis not present

## 2023-01-28 DIAGNOSIS — F419 Anxiety disorder, unspecified: Secondary | ICD-10-CM | POA: Diagnosis not present

## 2023-01-28 DIAGNOSIS — F4310 Post-traumatic stress disorder, unspecified: Secondary | ICD-10-CM | POA: Diagnosis not present

## 2023-01-28 NOTE — Progress Notes (Signed)
  Patient reported to [facility] - Outpatient clinic for session #22 of Repetitive Transcranial Magnetic Stimulation treatment for Major Depressive Disorder/Treatment-Resistant Depression. Pt's treatment parameters are as follows: A/P = 15.6 cm, SOA = 35 degrees, Coil angle = +5 degrees, Motor Threshold = 1.29 SMT. With these parameters, the pt will receive 36 sessions of TMS according to the following protocol: 3000 pulses per session, with stimulation in bursts of pulses lasting 4 seconds at a frequency of 10 Hz, separated by 11 seconds of rest.    Patient arrived and reported an increase in anxiety and PTSD symptoms. Patient stated increase was related to their work environment (lack of support, no team bonding, etc) and verbally stated "it's hard to know if the treatments are working when work causes me to feel like this." [employer] Tech informed patient that treatments are showing improvements, as PHQ scores show an average of a 50% reduction in symptoms.  [employer] tech collected weekly vitals and PHQ screening, along with an overdue AOB form. Pt's vitals were as follows: BP = 106/68, pulse = 74, temp = 97.6, respiratory rate = 20. Patient scored an 8 on the PHQ-9. Patient did not complete the AOB form. [employer] Tech aligned patient up to their individualized measurements and re-confirmed plan of keeping patient distracted during treatment (i.e., watching TV, talking about work stress, Clinical cytogeneticist) while observing responses. Patient consented to plan. TMS Tech applied the first burst of pulses at a reduced power of 105% due to weekend break. No discomfort was reported. No twitching of the patient's right hand or lower extremities were observed or reported during the session. TMS tech then titrated treatment power up to 115% for majority of the session, then titrated to 120% for final 5 minutes. Patient tolerated power increases well with no discomfort reported. Patient chatted with TMS tech  while  listening to preferred music selection. Patient departed from treatment with no complaints or concerns.

## 2023-01-29 ENCOUNTER — Other Ambulatory Visit (HOSPITAL_COMMUNITY): Payer: 59

## 2023-01-29 DIAGNOSIS — F329 Major depressive disorder, single episode, unspecified: Secondary | ICD-10-CM | POA: Diagnosis not present

## 2023-01-29 DIAGNOSIS — F419 Anxiety disorder, unspecified: Secondary | ICD-10-CM | POA: Diagnosis not present

## 2023-01-29 DIAGNOSIS — F338 Other recurrent depressive disorders: Secondary | ICD-10-CM | POA: Insufficient documentation

## 2023-01-29 DIAGNOSIS — F331 Major depressive disorder, recurrent, moderate: Secondary | ICD-10-CM | POA: Diagnosis not present

## 2023-01-29 DIAGNOSIS — F4310 Post-traumatic stress disorder, unspecified: Secondary | ICD-10-CM | POA: Diagnosis not present

## 2023-01-29 NOTE — Progress Notes (Signed)
Patient reported to [facility] - Outpatient clinic for session #23 of Repetitive Transcranial Magnetic Stimulation treatment for Major Depressive Disorder/Treatment-Resistant Depression. Pt's treatment parameters are as follows: A/P = 15.6 cm, SOA = 35 degrees, Coil angle = +5 degrees, Motor Threshold = 1.29 SMT. With these parameters, the pt will receive 36 sessions of TMS according to the following protocol: 3000 pulses per session, with stimulation in bursts of pulses lasting 4 seconds at a frequency of 10 Hz, separated by 11 seconds of rest.    Patient arrived and reported a decrease in anxiety/PTSD symptoms. Patient stated ithey had taken their anti-anxiety medication prior to session to prepare for a meeting with a supervisor later on in the day. [employer] Tech wished patient luck with the meeting while they signed the AOB form. [employer] Tech aligned patient up to their individualized measurements and re-confirmed plan of keeping patient distracted during treatment (i.e., watching TV, talking about work stress, Clinical cytogeneticist) while observing responses. Patient consented to plan. TMS Tech applied the first burst of pulses at a reduced power of 110% due to weekend break. No discomfort was reported. No twitching of the patient's right hand or lower extremities were observed or reported during the session. TMS tech then titrated treatment power up to 115% for majority of the session, then titrated to 120% for final 5 minutes. Patient tolerated power increases well with no discomfort reported. Patient chatted with TMS tech while listening to preferred music selection. Patient then departed from treatment with no complaints or concerns.

## 2023-01-30 ENCOUNTER — Other Ambulatory Visit (HOSPITAL_COMMUNITY): Payer: 59

## 2023-01-30 DIAGNOSIS — F329 Major depressive disorder, single episode, unspecified: Secondary | ICD-10-CM

## 2023-01-30 DIAGNOSIS — F331 Major depressive disorder, recurrent, moderate: Secondary | ICD-10-CM | POA: Diagnosis not present

## 2023-01-30 DIAGNOSIS — F4310 Post-traumatic stress disorder, unspecified: Secondary | ICD-10-CM | POA: Diagnosis not present

## 2023-01-30 DIAGNOSIS — F338 Other recurrent depressive disorders: Secondary | ICD-10-CM | POA: Insufficient documentation

## 2023-01-30 DIAGNOSIS — F419 Anxiety disorder, unspecified: Secondary | ICD-10-CM | POA: Diagnosis not present

## 2023-01-30 NOTE — Progress Notes (Signed)
Patient reported to [facility] - Outpatient clinic for session #24 of Repetitive Transcranial Magnetic Stimulation treatment for Major Depressive Disorder/Treatment-Resistant Depression. Pt's treatment parameters are as follows: A/P = 15.6 cm, SOA = 35 degrees, Coil angle = +5 degrees, Motor Threshold = 1.29 SMT. With these parameters, the pt will receive 36 sessions of TMS according to the following protocol: 3000 pulses per session, with stimulation in bursts of pulses lasting 4 seconds at a frequency of 10 Hz, separated by 11 seconds of rest.    Patient arrived and reported a decrease in anxiety/PTSD symptoms, even though their working environment has not changed. Patient reported feeling disappointed and emotionally fatigued, but emphasized that they want to do what's best for their mental health and make that the priority. [employer] Tech aligned patient up to their individualized measurements and re-confirmed plan of keeping patient distracted during treatment (i.e., watching TV, talking about work stress, Clinical cytogeneticist) while observing responses. Patient consented to plan. TMS Tech applied the first burst of pulses at a reduced power of 115%. No discomfort was reported. No twitching of the patient's right hand or lower extremities were observed or reported during the session. TMS tech kept current titration of 115% for half of the treatment session, then increased treatment power to 120% for the remaining half. Patient tolerated power increases well with no discomfort reported. Patient chatted with TMS tech while listening to preferred music selection. Patient then departed from treatment with no complaints or concerns.

## 2023-01-31 ENCOUNTER — Other Ambulatory Visit (HOSPITAL_BASED_OUTPATIENT_CLINIC_OR_DEPARTMENT_OTHER): Payer: 59

## 2023-01-31 DIAGNOSIS — F419 Anxiety disorder, unspecified: Secondary | ICD-10-CM | POA: Diagnosis not present

## 2023-01-31 DIAGNOSIS — F4310 Post-traumatic stress disorder, unspecified: Secondary | ICD-10-CM | POA: Diagnosis not present

## 2023-01-31 DIAGNOSIS — F331 Major depressive disorder, recurrent, moderate: Secondary | ICD-10-CM | POA: Diagnosis not present

## 2023-01-31 DIAGNOSIS — F329 Major depressive disorder, single episode, unspecified: Secondary | ICD-10-CM

## 2023-01-31 NOTE — Progress Notes (Signed)
Patient reported to [facility] - Outpatient clinic for session #25 of Repetitive Transcranial Magnetic Stimulation treatment for Major Depressive Disorder/Treatment-Resistant Depression. Pt's treatment parameters are as follows: A/P = 15.6 cm, SOA = 35 degrees, Coil angle = +5 degrees, Motor Threshold = 1.29 SMT. With these parameters, the pt will receive 36 sessions of TMS according to the following protocol: 3000 pulses per session, with stimulation in bursts of pulses lasting 4 seconds at a frequency of 10 Hz, separated by 11 seconds of rest.    Patient arrived and exhibited an appropriate affect, ready for treatment.. Patient reported that they were still unsure of how effective treatment has been so far. TMS Tech reassured patient that slight differences may be more noticeable to their external social supports, and that a decrease in PHQ score has been observed up to 50%. [employer] Tech aligned patient up to their individualized measurements and re-confirmed plan of keeping patient distracted during treatment (i.e., watching TV, talking about work stress, Clinical cytogeneticist) while observing responses. Patient consented to plan. TMS Tech applied the first burst of pulses at a reduced power of 115%. No discomfort was reported. No twitching of the patient's right hand or lower extremities were observed or reported during the session. TMS tech kept current titration of 115% for the first 4 minutes, then increased treatment power to 120%for the remaining 14 minutes. Patient tolerated power increases well with no discomfort reported. Patient stated "I didn't even notice a difference in the pulses." Patient continued to chat with TMS tech while listening to preferred music selection. Patient then departed from treatment with no complaints or concerns.

## 2023-02-01 ENCOUNTER — Ambulatory Visit: Payer: 59 | Admitting: Family Medicine

## 2023-02-01 ENCOUNTER — Other Ambulatory Visit (HOSPITAL_COMMUNITY): Payer: 59 | Attending: Psychiatry

## 2023-02-01 DIAGNOSIS — F419 Anxiety disorder, unspecified: Secondary | ICD-10-CM | POA: Insufficient documentation

## 2023-02-01 DIAGNOSIS — F4310 Post-traumatic stress disorder, unspecified: Secondary | ICD-10-CM | POA: Insufficient documentation

## 2023-02-01 DIAGNOSIS — F329 Major depressive disorder, single episode, unspecified: Secondary | ICD-10-CM

## 2023-02-01 DIAGNOSIS — F339 Major depressive disorder, recurrent, unspecified: Secondary | ICD-10-CM | POA: Diagnosis not present

## 2023-02-01 NOTE — Progress Notes (Signed)
Patient reported to [facility] - Outpatient clinic for session #26 of Repetitive Transcranial Magnetic Stimulation treatment for Major Depressive Disorder/Treatment-Resistant Depression. Pt's treatment parameters are as follows: A/P = 15.6 cm, SOA = 35 degrees, Coil angle = +5 degrees, Motor Threshold = 1.29 SMT. With these parameters, the pt will receive 36 sessions of TMS according to the following protocol: 3000 pulses per session, with stimulation in bursts of pulses lasting 4 seconds at a frequency of 10 Hz, separated by 11 seconds of rest.    Patient arrived and exhibited a decreased affect, due to additional work stressors that. Patient continued to report that they were still unsure of how effective treatment has been so far, as they spend their time worrying about a lack of a break, inconsistent work Doctor, general practice, and general lack of support from coworkers. TMS Tech reassured patient that slight differences may be more noticeable to their external social supports, and that a decrease in PHQ score has been observed up to 50%. [employer] Tech also assured patient that they were given an excused note from the resident provider for an uninterrupted 60 minutes to complete treatments. FMLA paperwork was discussed, patient will let TMS Tech know if paperwork is needed. TMS Tech then aligned patient up to their individualized measurements and re-confirmed plan of keeping patient distracted during treatment (i.e., watching TV, talking about work stress, Clinical cytogeneticist) while observing responses. Patient consented to plan. TMS Tech applied the first burst of pulses at a reduced power of 115%. No discomfort was reported. No twitching of the patient's right hand or lower extremities were observed or reported during the session. TMS tech kept current titration of 115% for the first 3 minutes, then increased treatment power to 120% for the remaining 15 minutes. Patient tolerated power increases well with  no discomfort reported. Patient stated "I didn't even notice a difference in the pulses." Patient continued to chat with TMS tech while listening to preferred music selection. Patient then departed from treatment with no complaints or concerns.

## 2023-02-04 ENCOUNTER — Other Ambulatory Visit (HOSPITAL_BASED_OUTPATIENT_CLINIC_OR_DEPARTMENT_OTHER): Payer: 59

## 2023-02-04 VITALS — BP 106/62 | HR 100 | Temp 97.6°F | Resp 20

## 2023-02-04 DIAGNOSIS — F329 Major depressive disorder, single episode, unspecified: Secondary | ICD-10-CM

## 2023-02-04 DIAGNOSIS — F4310 Post-traumatic stress disorder, unspecified: Secondary | ICD-10-CM | POA: Diagnosis not present

## 2023-02-04 DIAGNOSIS — F332 Major depressive disorder, recurrent severe without psychotic features: Secondary | ICD-10-CM | POA: Diagnosis not present

## 2023-02-04 DIAGNOSIS — F419 Anxiety disorder, unspecified: Secondary | ICD-10-CM | POA: Diagnosis not present

## 2023-02-04 DIAGNOSIS — F411 Generalized anxiety disorder: Secondary | ICD-10-CM | POA: Diagnosis not present

## 2023-02-04 DIAGNOSIS — F339 Major depressive disorder, recurrent, unspecified: Secondary | ICD-10-CM | POA: Diagnosis not present

## 2023-02-04 NOTE — Progress Notes (Signed)
Patient reported to [facility] - Outpatient clinic for session #27 of Repetitive Transcranial Magnetic Stimulation treatment for Major Depressive Disorder/Treatment-Resistant Depression. Pt's treatment parameters are as follows: A/P = 15.6 cm, SOA = 35 degrees, Coil angle = +5 degrees, Motor Threshold = 1.29 SMT. With these parameters, the pt will receive 36 sessions of TMS according to the following protocol: 3000 pulses per session, with stimulation in bursts of pulses lasting 4 seconds at a frequency of 10 Hz, separated by 11 seconds of rest.    Patient arrived and exhibited a slightly decreased/quieter affect, but still appropriate for treatment. Patient reported taking different OTC meds (Advil instead of Tylenol) before session, TMS Tech said they would start titration lower than previous session and gauge toleration response. [employer] tech collected weekly vitals and PHQ screening. Pt's vitals were as follows: BP = 106/62, pulse = 100, temp = 97.6, respiratory rate = 20. Patient scored a 1 on the PHQ-9. [employer] Tech aligned patient up to their individualized measurements and re-confirmed plan of keeping patient distracted during treatment (i.e., watching TV, talking about work stress, Clinical cytogeneticist) while observing responses. Patient consented to plan. TMS Tech applied the first burst of pulses at a reduced power of 115% due to weekend break. No discomfort was reported. No twitching of the patient's right hand or lower extremities were observed or reported during the session. After 5 minutes at 115%, TMS tech then titrated treatment power up to 120%. No initial discomfort was reported.   After 11 minutes of the session, Patient exhibited a novel 'jerk' reaction and inhaled quickly. TMS Tech immediately paused the session to monitor and affirm patient was okay. Patient verbally stated that reaction was not due to the treatment, but they felt as if they were about to have a panic attack.  Patient informed TMS Tech of precursor symptoms while TMS Tech manually checked radial pulse. Pulse did not exhibit tachycardia, skipping, or pauses. Pulse was found to be 95 bpm. TMS Tech kept the session paused for 5 minutes while patient drank some water, put cool compress on back of neck, and TMS tech increased airflow of table fan. Patient returned to feeling stable and verbally consented to finishing the session. [employer] Tech kept tx power at 115% to the end of session. Patient continued to chat with TMS tech while listening to preferred music selection. Patient then departed from treatment with no additional discomfort, panic symptoms, or complaints.

## 2023-02-05 ENCOUNTER — Other Ambulatory Visit (HOSPITAL_COMMUNITY): Payer: 59 | Attending: Psychiatry

## 2023-02-05 ENCOUNTER — Telehealth (HOSPITAL_COMMUNITY): Payer: Self-pay

## 2023-02-05 ENCOUNTER — Other Ambulatory Visit (HOSPITAL_COMMUNITY): Payer: 59

## 2023-02-05 DIAGNOSIS — F332 Major depressive disorder, recurrent severe without psychotic features: Secondary | ICD-10-CM | POA: Insufficient documentation

## 2023-02-05 DIAGNOSIS — F329 Major depressive disorder, single episode, unspecified: Secondary | ICD-10-CM | POA: Diagnosis not present

## 2023-02-05 NOTE — Telephone Encounter (Signed)
TMS C: Placed outgoing call to patient to check-in; patient currently 24 minutes past treatment session time when usually punctual/calls if going to be late. Will await call back before marking patient as no-show.

## 2023-02-05 NOTE — Telephone Encounter (Signed)
TMS C: Received incoming call from [facility] front desk Samson Frederic), who related patient call stating they would be unable to attend session as they are going to the emergency room. Coordinator will inform Resident provider and gauge next steps.

## 2023-02-05 NOTE — Progress Notes (Signed)
Patient reported to [facility] - Outpatient clinic for session #28 of Repetitive Transcranial Magnetic Stimulation treatment for Major Depressive Disorder/Treatment-Resistant Depression. Pt's treatment parameters are as follows: A/P = 15.6 cm, SOA = 35 degrees, Coil angle = +5 degrees, Motor Threshold = 1.29 SMT. With these parameters, the pt will receive 36 sessions of TMS according to the following protocol: 3000 pulses per session, with stimulation in bursts of pulses lasting 4 seconds at a frequency of 10 Hz, separated by 11 seconds of rest.    Patient arrived and exhibited an appropriate affect for treatment, with a reported increase in overall mood. No additional stress or anxiety was reported. [employer] Tech aligned patient up to their individualized measurements and re-confirmed plan of keeping patient distracted during treatment (i.e., watching TV, talking about work stress, Clinical cytogeneticist) while observing responses. Patient consented to plan. TMS Tech applied the first burst of pulses at a reduced power of 115% due to increased panic symptoms from previous session. No discomfort was reported. No twitching of the patient's right hand or lower extremities were observed or reported during the session. After 5 minutes at 115%, TMS tech then titrated treatment power up to 120%. No discomfort was reported. TMS Tech kept titration at 120% while closely monitoring patient. No jerking movements, reactions, or precursor panic symptoms were observed or reported. Patient continued to chat with TMS tech while listening to preferred music selection. Patient then departed from treatment with no additional discomfort, panic symptoms, or complaints.

## 2023-02-05 NOTE — Telephone Encounter (Signed)
TMS: Correction - patient was not admitted to ED. Pt was placed 1:1 on [facility] inpatient unit, emergent situation occurred and pt needed to travel with over to ED. Patient was rescheduled for later afternoon and successfully attended their TMS session for 02/05/23.

## 2023-02-06 ENCOUNTER — Other Ambulatory Visit (HOSPITAL_BASED_OUTPATIENT_CLINIC_OR_DEPARTMENT_OTHER): Payer: 59

## 2023-02-06 DIAGNOSIS — F339 Major depressive disorder, recurrent, unspecified: Secondary | ICD-10-CM | POA: Diagnosis not present

## 2023-02-06 DIAGNOSIS — F419 Anxiety disorder, unspecified: Secondary | ICD-10-CM | POA: Diagnosis not present

## 2023-02-06 DIAGNOSIS — F329 Major depressive disorder, single episode, unspecified: Secondary | ICD-10-CM | POA: Diagnosis not present

## 2023-02-06 DIAGNOSIS — F4310 Post-traumatic stress disorder, unspecified: Secondary | ICD-10-CM | POA: Diagnosis not present

## 2023-02-06 NOTE — Progress Notes (Signed)
Patient reported to [facility] - Outpatient clinic for session #29 of Repetitive Transcranial Magnetic Stimulation treatment for Major Depressive Disorder/Treatment-Resistant Depression. Pt's treatment parameters are as follows: A/P = 15.6 cm, SOA = 35 degrees, Coil angle = +5 degrees, Motor Threshold = 1.29 SMT. With these parameters, the pt will receive 36 sessions of TMS according to the following protocol: 3000 pulses per session, with stimulation in bursts of pulses lasting 4 seconds at a frequency of 10 Hz, separated by 11 seconds of rest.    Patient arrived and reported a stable/good mood, exhibited an appropriate affect for treatment. No additional stress or anxiety was reported. [employer] aligned patient up to their individualized measurements and re-confirmed plan of keeping patient distracted during treatment (i.e., watching TV, talking about work stress, Clinical cytogeneticist) while observing responses. Patient consented to plan. TMS Tech applied the first burst of pulses at a reduced power of 115% to gauge response. No discomfort or panic symptoms were reported. No twitching of the patient's right hand or lower extremities were observed or reported during the session. After 5 minutes at 115%, TMS tech then titrated treatment power up to 120% for the remainder of the session. No discomfort was reported. TMS Tech kept titration at 120% while closely monitoring patient. No jerking movements, reactions, or precursor panic symptoms were observed or reported. Patient continued to chat with TMS tech while listening to preferred music selection. Patient then departed from treatment with no additional discomfort or complaints.

## 2023-02-07 ENCOUNTER — Other Ambulatory Visit (HOSPITAL_BASED_OUTPATIENT_CLINIC_OR_DEPARTMENT_OTHER): Payer: 59

## 2023-02-07 DIAGNOSIS — F4310 Post-traumatic stress disorder, unspecified: Secondary | ICD-10-CM | POA: Diagnosis not present

## 2023-02-07 DIAGNOSIS — F339 Major depressive disorder, recurrent, unspecified: Secondary | ICD-10-CM | POA: Diagnosis not present

## 2023-02-07 DIAGNOSIS — F329 Major depressive disorder, single episode, unspecified: Secondary | ICD-10-CM | POA: Diagnosis not present

## 2023-02-07 DIAGNOSIS — F419 Anxiety disorder, unspecified: Secondary | ICD-10-CM | POA: Diagnosis not present

## 2023-02-07 NOTE — Progress Notes (Signed)
Patient reported to [facility] - Outpatient clinic for session #30 of Repetitive Transcranial Magnetic Stimulation treatment for Major Depressive Disorder/Treatment-Resistant Depression. Pt's treatment parameters are as follows: A/P = 15.6 cm, SOA = 35 degrees, Coil angle = +5 degrees, Motor Threshold = 1.29 SMT. With these parameters, the pt will receive 36 sessions of TMS according to the following protocol: 3000 pulses per session, with stimulation in bursts of pulses lasting 4 seconds at a frequency of 10 Hz, separated by 11 seconds of rest.    Patient arrived and reported additional work/environmental stressors affecting mood. Patient reported taking an increase in the amount of medications taken in the early morning to decrease anxiety and physiological symptoms. Patient confirmed additional medications were "take-as-needed," and approved by PCP. [employer] aligned patient up to their individualized measurements and re-confirmed plan of keeping patient distracted during treatment (i.e., watching TV, talking about work stress, Clinical cytogeneticist) while observing responses. Patient consented to plan. TMS Tech applied the first burst of pulses at a reduced power of 115% to gauge response. No discomfort or panic symptoms were reported. No twitching of the patient's right hand or lower extremities were observed or reported during the session. After 5 minutes at 115%, TMS tech then titrated treatment power up to 120% for the remainder of the session. No discomfort was reported. TMS Tech kept titration at 120% while closely monitoring patient. No jerking movements, reactions, or precursor panic symptoms were observed or reported. Patient continued to chat with TMS tech while listening to preferred music selection. Patient then departed from treatment with no additional discomfort or complaints.

## 2023-02-08 ENCOUNTER — Other Ambulatory Visit (HOSPITAL_BASED_OUTPATIENT_CLINIC_OR_DEPARTMENT_OTHER): Payer: 59

## 2023-02-08 DIAGNOSIS — F329 Major depressive disorder, single episode, unspecified: Secondary | ICD-10-CM

## 2023-02-08 DIAGNOSIS — F339 Major depressive disorder, recurrent, unspecified: Secondary | ICD-10-CM | POA: Diagnosis not present

## 2023-02-08 DIAGNOSIS — F419 Anxiety disorder, unspecified: Secondary | ICD-10-CM | POA: Diagnosis not present

## 2023-02-08 DIAGNOSIS — F4310 Post-traumatic stress disorder, unspecified: Secondary | ICD-10-CM | POA: Diagnosis not present

## 2023-02-08 NOTE — Progress Notes (Signed)
Patient reported to [facility] - Outpatient clinic for session #31 of Repetitive Transcranial Magnetic Stimulation treatment for Major Depressive Disorder/Treatment-Resistant Depression. Pt's treatment parameters are as follows: A/P = 15.6 cm, SOA = 35 degrees, Coil angle = +5 degrees, Motor Threshold = 1.29 SMT. With these parameters, the pt will receive 36 sessions of TMS according to the following protocol: 3000 pulses per session, with stimulation in bursts of pulses lasting 4 seconds at a frequency of 10 Hz, separated by 11 seconds of rest.    Patient arrived and reported an increase in emotional regulation and overall mood. Patient confirmed that no additional medications were needed prior to session. [employer] aligned patient up to their individualized measurements and re-confirmed plan of keeping patient distracted during treatment (i.e., watching TV, talking about work stress, Clinical cytogeneticist) while observing responses. Patient consented to plan. TMS Tech applied the first burst of pulses at a reduced power of 115% to gauge response. No discomfort or precursor panic symptoms were reported. No twitching of the patient's right hand or lower extremities were observed or reported during the session. After 5 minutes at 115%, TMS tech then titrated treatment power up to 120% for the remainder of the session. No discomfort was reported. TMS Tech kept titration at 120% and patient tolerated full treatment power well. Patient continued to chat with TMS tech while listening to preferred music selection. Patient departed from treatment with no additional discomfort or complaints.

## 2023-02-11 ENCOUNTER — Other Ambulatory Visit (HOSPITAL_COMMUNITY): Payer: 59 | Attending: Psychiatry

## 2023-02-11 VITALS — BP 101/70 | HR 88 | Temp 97.5°F

## 2023-02-11 DIAGNOSIS — F329 Major depressive disorder, single episode, unspecified: Secondary | ICD-10-CM

## 2023-02-11 DIAGNOSIS — F332 Major depressive disorder, recurrent severe without psychotic features: Secondary | ICD-10-CM | POA: Insufficient documentation

## 2023-02-11 DIAGNOSIS — F419 Anxiety disorder, unspecified: Secondary | ICD-10-CM | POA: Diagnosis not present

## 2023-02-11 NOTE — Progress Notes (Signed)
Patient reported to [facility] - Outpatient clinic for session #32 of Repetitive Transcranial Magnetic Stimulation treatment for Major Depressive Disorder/Treatment-Resistant Depression. Pt's treatment parameters are as follows: A/P = 15.6 cm, SOA = 35 degrees, Coil angle = +5 degrees, Motor Threshold = 1.29 SMT. With these parameters, the pt will receive 36 sessions of TMS according to the following protocol: 3000 pulses per session, with stimulation in bursts of pulses lasting 4 seconds at a frequency of 10 Hz, separated by 11 seconds of rest.    Patient arrived and exhibited a slightly decreased/quieter affect, but still appropriate for treatment. [employer] tech collected weekly vitals and PHQ screening. Pt's vitals were as follows: BP = 101/70, pulse = 88, temp = 97.5. Patient scored a 3 on the PHQ-9. [employer] aligned patient up to their individualized measurements and re-confirmed plan of keeping patient distracted during treatment (i.e., watching TV, talking about work stress, Clinical cytogeneticist) while observing responses. Patient consented to plan. Difficulty in keeping coil connected to pt's scalp was observed before titrations were applied, due to pt's new hairstyle. [employer] sent a message to resident provider about adjusting measurements; provider agreed to changes. TMS Tech moved the A/P bar forward by 2 points and was able to connect the bottom dot of coil to pt's tx site. TMS Tech applied the first burst of pulses at a reduced power of 110% due to adjustments. No discomfort was reported. No twitching of the patient's right hand or lower extremities were observed or reported during the session. After 5 minutes at 110%, TMS tech then titrated treatment power up to 115%. No discomfort was reported. After 5 minutes at 115%, TMS Tech increased tx power to 120% and pt tolerated increase well. Patient continued to chat with TMS tech while listening to preferred music selection. Patient then  departed from treatment with no discomfort or complaints.

## 2023-02-12 ENCOUNTER — Other Ambulatory Visit (HOSPITAL_COMMUNITY): Payer: 59 | Attending: Psychiatry

## 2023-02-12 DIAGNOSIS — F332 Major depressive disorder, recurrent severe without psychotic features: Secondary | ICD-10-CM | POA: Insufficient documentation

## 2023-02-12 DIAGNOSIS — F329 Major depressive disorder, single episode, unspecified: Secondary | ICD-10-CM

## 2023-02-12 NOTE — Progress Notes (Signed)
Patient reported to [facility] - Outpatient clinic for session #33 of Repetitive Transcranial Magnetic Stimulation treatment for Major Depressive Disorder/Treatment-Resistant Depression. Pt's treatment parameters are as follows: A/P = 15.6 cm, SOA = 35 degrees, Coil angle = +5 degrees, Motor Threshold = 1.29 SMT. With these parameters, the pt will receive 36 sessions of TMS according to the following protocol: 3000 pulses per session, with stimulation in bursts of pulses lasting 4 seconds at a frequency of 10 Hz, separated by 11 seconds of rest.    Patient arrived and exhibited a slightly decreased/quieter affect, but still appropriate for treatment. [employer] aligned patient up to their individualized measurements and re-confirmed plan of keeping patient distracted during treatment (i.e., watching TV, talking about work stress, Clinical cytogeneticist) while observing responses. Patient consented to plan. Difficulty in keeping coil connected to pt's scalp was observed after initial titrations had began, due to pt's new hairstyle. TMS Tech moved the A/P bar forward by 2 points and was able to connect the bottom dot of coil to pt's tx site. Connection remained spotty and patient reported feeling twitching in pinky finger. TMS Tech returned adjustments back to patient's normal and placed hand lightly on tx coil. Keeping pressure on coil assisted with top dot connection. TMS Tech increased tx power to 115% and no additional discomfort was reported. TMS Tech kept pulses at 115% for majority of the session, due to increased pressure. No discomfort was reported. No twitching of the patient's right hand was further observed or reported. TMS Tech increased tx power to 120% for remaining 5 minutes and pt tolerated increase well. Patient continued to chat with TMS tech while listening to preferred music selection. Patient then departed from treatment with no discomfort or complaints.

## 2023-02-13 ENCOUNTER — Other Ambulatory Visit (HOSPITAL_COMMUNITY): Payer: 59 | Attending: Psychiatry

## 2023-02-13 DIAGNOSIS — F329 Major depressive disorder, single episode, unspecified: Secondary | ICD-10-CM | POA: Insufficient documentation

## 2023-02-13 NOTE — Progress Notes (Signed)
Patient reported to [facility] - Outpatient clinic for session #34 of Repetitive Transcranial Magnetic Stimulation treatment for Major Depressive Disorder/Treatment-Resistant Depression. Pt's treatment parameters are as follows: A/P = 15.6 cm, SOA = 35 degrees, Coil angle = +5 degrees, Motor Threshold = 1.29 SMT. With these parameters, the pt will receive 36 sessions of TMS according to the following protocol: 3000 pulses per session, with stimulation in bursts of pulses lasting 4 seconds at a frequency of 10 Hz, separated by 11 seconds of rest.   Patient presented with an appropriate affect, level mood, and denied any changes suicidal or homicidal ideations. Patient reported no change in alcohol/substance use, caffeine consumption, sleep pattern or metal implant status.  [employer] aligned patient up to their individualized measurements and re-confirmed plan of keeping patient distracted during treatment (e.g., watching TV, talking about work stress, Clinical cytogeneticist) while observing responses. Patient consented to plan. Initial titrations were set to 115%. Difficulty in keeping coil connected to pt's scalp was observed after initial titrations had began, due to pt's new hairstyle. TMS Tech moved the A/P bar forward by 2 points and was able to connect the bottom dot of coil to pt's tx site. Connection remained solid with no additional pressure needed, but patient reported feeling twitching in pinky and ring finger. No other discomfort was reported.TMS Tech observed patient's right hand for two pulse trains and confirmed twitching was evident. TMS Tech reported twitching to resident provider but continued to monitor patient's session. Resident provider agreed to keep session running with increased observation. Since patient is almost done with full treatment, no re-mapping is needed at this time. TMS Tech increased tx power to 120%. Patient verbally confirmed no discomfort and tolerated full  power well. Patient continued to chat with TMS tech while listening to preferred music selection. Patient then departed from treatment with no discomfort or complaints.

## 2023-02-15 ENCOUNTER — Other Ambulatory Visit (HOSPITAL_BASED_OUTPATIENT_CLINIC_OR_DEPARTMENT_OTHER): Payer: 59

## 2023-02-15 DIAGNOSIS — F329 Major depressive disorder, single episode, unspecified: Secondary | ICD-10-CM

## 2023-02-15 NOTE — Progress Notes (Signed)
Patient reported to [facility] - Outpatient clinic for session #35 of Repetitive Transcranial Magnetic Stimulation treatment for Major Depressive Disorder/Treatment-Resistant Depression. Pt's treatment parameters are as follows: A/P = 15.6 cm, SOA = 35 degrees, Coil angle = +5 degrees, Motor Threshold = 1.29 SMT. With these parameters, the pt will receive 36 sessions of TMS according to the following protocol: 3000 pulses per session, with stimulation in bursts of pulses lasting 4 seconds at a frequency of 10 Hz, separated by 11 seconds of rest.    Patient presented with an appropriate affect, level mood, and denied any changes suicidal or homicidal ideations. Patient reported no change in alcohol/substance use, caffeine consumption, sleep pattern or metal implant status.  [employer] aligned patient up to their individualized measurements and re-confirmed plan of keeping patient distracted during treatment (e.g., watching TV, talking about work stress, Clinical cytogeneticist) while observing responses. Patient consented to plan. Initial titrations were set to 115%. No difficulty in keeping coil connected to pt's scalp was observed during the session. No discomfort was reported. TMS Tech increased tx power to 120% after 5 minutes. Patient verbally confirmed no discomfort and tolerated full power well. Patient continued to chat with TMS tech while listening to preferred music selection. Patient then departed from treatment with no discomfort or complaints; eager to finish full course of treatment during the next session.

## 2023-02-18 ENCOUNTER — Other Ambulatory Visit (HOSPITAL_BASED_OUTPATIENT_CLINIC_OR_DEPARTMENT_OTHER): Payer: 59

## 2023-02-18 VITALS — BP 100/69 | HR 91 | Temp 97.7°F | Resp 16

## 2023-02-18 DIAGNOSIS — F331 Major depressive disorder, recurrent, moderate: Secondary | ICD-10-CM | POA: Diagnosis not present

## 2023-02-18 DIAGNOSIS — F4312 Post-traumatic stress disorder, chronic: Secondary | ICD-10-CM | POA: Diagnosis not present

## 2023-02-18 DIAGNOSIS — F329 Major depressive disorder, single episode, unspecified: Secondary | ICD-10-CM

## 2023-02-18 NOTE — Progress Notes (Signed)
Patient reported to [facility] - Outpatient clinic for session #36 of Repetitive Transcranial Magnetic Stimulation treatment for Major Depressive Disorder/Treatment-Resistant Depression. Pt's treatment parameters are as follows: A/P = 15.6 cm, SOA = 35 degrees, Coil angle = +5 degrees, Motor Threshold = 1.29 SMT. With these parameters, the pt will receive 36 sessions of TMS according to the following protocol: 3000 pulses per session, with stimulation in bursts of pulses lasting 4 seconds at a frequency of 10 Hz, separated by 11 seconds of rest.    Patient presented with an appropriate and relaxed affect, level mood, and denied any changes suicidal or homicidal ideations. Patient reported no change in alcohol/substance use, caffeine consumption, sleep pattern or metal implant status. When discussing the effectiveness of treatment, Patient reported feeling an increase in overall energy, motivation, willingness to engage in new activities, and utilizing healthy coping skills when environmental stressors occur. [employer] collected weekly PHQ-9 assessment and vitals and 10:15am. Vitals are as follows: BP = 100/69, pulse = 91, respiratory rate = 16, temperature = 97.7. Patient's initial PHQ score was a 16. Patient scored a 2 upon their final treatment session. [employer] aligned patient up to their individualized measurements and re-confirmed plan of keeping patient distracted during treatment (e.g., watching TV, talking about work stress, Clinical cytogeneticist) while observing responses. Patient consented to plan. Initial titrations were set to 120% for the final session. No difficulty in keeping coil connected to pt's scalp was observed during the session. No discomfort or additional twitching was reported/observed. Patient continued to tolerate full treatment power well. Patient continued to chat with TMS tech while listening to preferred music selection. Upon conclusion, patient was informed of  post-treatment survey that will be sent electronically. [employer] also discussed with patient that if another course of treatment was needed, patient can do so 12 months from final session (02/18/23). Patient thanked [employer], then departed from treatment with no discomfort or complaints.

## 2023-02-25 DIAGNOSIS — F331 Major depressive disorder, recurrent, moderate: Secondary | ICD-10-CM | POA: Diagnosis not present

## 2023-02-25 DIAGNOSIS — F4312 Post-traumatic stress disorder, chronic: Secondary | ICD-10-CM | POA: Diagnosis not present

## 2023-03-11 ENCOUNTER — Ambulatory Visit: Payer: 59 | Admitting: Family Medicine

## 2023-03-11 DIAGNOSIS — F331 Major depressive disorder, recurrent, moderate: Secondary | ICD-10-CM | POA: Diagnosis not present

## 2023-03-11 DIAGNOSIS — F4312 Post-traumatic stress disorder, chronic: Secondary | ICD-10-CM | POA: Diagnosis not present

## 2023-03-20 DIAGNOSIS — F331 Major depressive disorder, recurrent, moderate: Secondary | ICD-10-CM | POA: Diagnosis not present

## 2023-03-20 DIAGNOSIS — F4312 Post-traumatic stress disorder, chronic: Secondary | ICD-10-CM | POA: Diagnosis not present

## 2023-03-25 DIAGNOSIS — F331 Major depressive disorder, recurrent, moderate: Secondary | ICD-10-CM | POA: Diagnosis not present

## 2023-03-25 DIAGNOSIS — F4312 Post-traumatic stress disorder, chronic: Secondary | ICD-10-CM | POA: Diagnosis not present

## 2023-03-29 DIAGNOSIS — F411 Generalized anxiety disorder: Secondary | ICD-10-CM | POA: Diagnosis not present

## 2023-03-29 DIAGNOSIS — F332 Major depressive disorder, recurrent severe without psychotic features: Secondary | ICD-10-CM | POA: Diagnosis not present

## 2023-03-29 DIAGNOSIS — F4310 Post-traumatic stress disorder, unspecified: Secondary | ICD-10-CM | POA: Diagnosis not present

## 2023-04-04 DIAGNOSIS — F4312 Post-traumatic stress disorder, chronic: Secondary | ICD-10-CM | POA: Diagnosis not present

## 2023-04-04 DIAGNOSIS — F331 Major depressive disorder, recurrent, moderate: Secondary | ICD-10-CM | POA: Diagnosis not present

## 2023-04-05 ENCOUNTER — Encounter: Payer: Self-pay | Admitting: General Practice

## 2023-04-05 ENCOUNTER — Ambulatory Visit: Payer: Commercial Managed Care - PPO | Admitting: General Practice

## 2023-04-05 ENCOUNTER — Other Ambulatory Visit: Payer: Self-pay | Admitting: General Practice

## 2023-04-05 VITALS — BP 106/72 | HR 79 | Temp 98.5°F | Ht 60.5 in | Wt 114.2 lb

## 2023-04-05 DIAGNOSIS — B351 Tinea unguium: Secondary | ICD-10-CM

## 2023-04-05 DIAGNOSIS — Z1159 Encounter for screening for other viral diseases: Secondary | ICD-10-CM | POA: Diagnosis not present

## 2023-04-05 DIAGNOSIS — F332 Major depressive disorder, recurrent severe without psychotic features: Secondary | ICD-10-CM | POA: Diagnosis not present

## 2023-04-05 DIAGNOSIS — K580 Irritable bowel syndrome with diarrhea: Secondary | ICD-10-CM | POA: Diagnosis not present

## 2023-04-05 DIAGNOSIS — E559 Vitamin D deficiency, unspecified: Secondary | ICD-10-CM | POA: Diagnosis not present

## 2023-04-05 DIAGNOSIS — F419 Anxiety disorder, unspecified: Secondary | ICD-10-CM

## 2023-04-05 DIAGNOSIS — Z7689 Persons encountering health services in other specified circumstances: Secondary | ICD-10-CM

## 2023-04-05 DIAGNOSIS — L501 Idiopathic urticaria: Secondary | ICD-10-CM | POA: Diagnosis not present

## 2023-04-05 DIAGNOSIS — F32A Depression, unspecified: Secondary | ICD-10-CM

## 2023-04-05 DIAGNOSIS — L709 Acne, unspecified: Secondary | ICD-10-CM | POA: Diagnosis not present

## 2023-04-05 DIAGNOSIS — N946 Dysmenorrhea, unspecified: Secondary | ICD-10-CM

## 2023-04-05 LAB — VITAMIN D 25 HYDROXY (VIT D DEFICIENCY, FRACTURES): VITD: 19.65 ng/mL — ABNORMAL LOW (ref 30.00–100.00)

## 2023-04-05 LAB — CBC
HCT: 40.3 % (ref 36.0–46.0)
Hemoglobin: 13.5 g/dL (ref 12.0–15.0)
MCHC: 33.6 g/dL (ref 30.0–36.0)
MCV: 93.8 fL (ref 78.0–100.0)
Platelets: 233 10*3/uL (ref 150.0–400.0)
RBC: 4.29 Mil/uL (ref 3.87–5.11)
RDW: 12.7 % (ref 11.5–15.5)
WBC: 4.4 10*3/uL (ref 4.0–10.5)

## 2023-04-05 LAB — COMPREHENSIVE METABOLIC PANEL
ALT: 8 U/L (ref 0–35)
AST: 16 U/L (ref 0–37)
Albumin: 4.6 g/dL (ref 3.5–5.2)
Alkaline Phosphatase: 35 U/L — ABNORMAL LOW (ref 39–117)
BUN: 11 mg/dL (ref 6–23)
CO2: 27 meq/L (ref 19–32)
Calcium: 9.7 mg/dL (ref 8.4–10.5)
Chloride: 102 meq/L (ref 96–112)
Creatinine, Ser: 1.07 mg/dL (ref 0.40–1.20)
GFR: 72.17 mL/min (ref >60.00)
Glucose, Bld: 88 mg/dL (ref 70–99)
Potassium: 4.6 meq/L (ref 3.5–5.1)
Sodium: 136 meq/L (ref 135–145)
Total Bilirubin: 0.3 mg/dL (ref 0.2–1.2)
Total Protein: 7.9 g/dL (ref 6.0–8.3)

## 2023-04-05 MED ORDER — DICYCLOMINE HCL 20 MG PO TABS: 20.0000 mg | ORAL_TABLET | Freq: Three times a day (TID) | ORAL | 1 refills | Status: AC

## 2023-04-05 MED ORDER — NAPROXEN 250 MG PO TABS: 250.0000 mg | ORAL_TABLET | Freq: Two times a day (BID) | ORAL | 0 refills | Status: AC | PRN

## 2023-04-05 MED ORDER — FEXOFENADINE HCL 180 MG PO TABS: 180.0000 mg | ORAL_TABLET | Freq: Every day | ORAL | 0 refills | Status: AC | PRN

## 2023-04-05 MED ORDER — TAVABOROLE 5 % EX SOLN: 1.0000 | Freq: Every day | CUTANEOUS | 0 refills | Status: DC

## 2023-04-05 MED ORDER — EFINACONAZOLE 10 % EX SOLN: 1.0000 | Freq: Every day | CUTANEOUS | 0 refills | Status: DC

## 2023-04-05 NOTE — Assessment & Plan Note (Signed)
 Patient stated symptoms well managed with Dicyclomine HCL 20 mg with meals.   Refill sent.   CMP and CBC pending.

## 2023-04-05 NOTE — Assessment & Plan Note (Signed)
Vitamin d level pending

## 2023-04-05 NOTE — Assessment & Plan Note (Addendum)
 Controlled.   Followed by psychiatry.      04/05/2023   10:07 AM  GAD 7 : Generalized Anxiety Score  Nervous, Anxious, on Edge 2  Control/stop worrying 2  Worry too much - different things 2  Trouble relaxing 1  Restless 1  Easily annoyed or irritable 0  Afraid - awful might happen 2  Total GAD 7 Score 10  Anxiety Difficulty Very difficult

## 2023-04-05 NOTE — Patient Instructions (Signed)
 Stop by the lab prior to leaving today. I will notify you of your results once received.   Refills sent.   You will either be contacted via phone regarding your referral to dermatology , or you may receive a letter on your MyChart portal from our referral team with instructions for scheduling an appointment. Please let us  know if you have not been contacted by anyone within two weeks.  Follow up in two weeks to discuss immune system.

## 2023-04-05 NOTE — Assessment & Plan Note (Addendum)
 Controlled.   Followed by psychiatry.     04/05/2023   10:07 AM 02/18/2023   11:52 AM 02/11/2023   10:32 AM 02/04/2023   12:25 PM 01/28/2023   10:42 AM  Depression screen PHQ 2/9  Decreased Interest 0      Down, Depressed, Hopeless 0      PHQ - 2 Score 0      Altered sleeping 1      Tired, decreased energy 1      Change in appetite 1      Feeling bad or failure about yourself  0      Trouble concentrating 1      Moving slowly or fidgety/restless 0      Suicidal thoughts 0      PHQ-9 Score 4      Difficult doing work/chores Somewhat difficult         Information is confidential and restricted. Go to Review Flowsheets to unlock data.

## 2023-04-05 NOTE — Assessment & Plan Note (Addendum)
 Controlled  Improved since she moved from [place].   Refill sent for Allegra 180 mg PRN.

## 2023-04-05 NOTE — Progress Notes (Signed)
 New Patient Office Visit  Subjective    Patient ID: Bianca Forbes, female    DOB: 22-Oct-1995  Age: 28 y.o. MRN: [id number]  CC:  Chief Complaint  Patient presents with   Establish Care    Rx refills dycyclomine 20 mg, naproxen  250 mg prn, allegra  180 mg, tavaborole  topical 5% solution    HPI Bianca Forbes is a 28 y.o. female presents to establish care and refills.   Previous PCP, physical, labs: [place] of [place], last physical and labs within the last year.   IBS: never formally diagnosed. Started dicyclomine  as a teen. With the medications, symptoms are managed well. She does not have a GI doctor. No previous colonoscopies. Dad has a history of IBS. She would like a refill on her bentyl .   Anxiety and depression: followed by psychiatrist, Dr. Akintayo. Currently managed on Wellbutrin , buspar , klonopin as needed. With the medications, symptoms are managed well. No concerns today. Denies SI/HI today.  Idiopathic urticaria: When she was living in [place] , she started to have itching throughout her body. It was contributed to allergies. She was prescribed Allegra  as needed. Since she moved to [facility], symptoms have improved and she is using it as needed. She would like a refill today.   Dysmenorrhea: she has a history of heavy and painful periods. She was prescribed Naproxen  250 mg as needed. It does help her pain. She uses it on either her first or second day of her period and she only takes one pill. She would like a refill. She had a OBGYN in [place]  and her pap smear within the last year. She would like to establish with a gyn eventually.   Left toe nail fungus : located on the 5th digit of her left foot. She was being treated in [place]  by a podiatrist. She was prescribed tavaborole  topical 5% solution. She has been using it and is working well for her. She would like a refill and eventually a refill to a podiatrist.   Acne: diagnosed a year ago by dermatologist in [place] . She was  started on spironolactone  125 mg at bedtime. She has been tolerating it well. She feels it is helping with her symptoms. She would like to establish with dermatology.   Vitamin D - history of vitamin d  deficiency. She would like to have it checked.    Outpatient Encounter Medications as of 04/05/2023  Medication Sig   Baclofen  5 MG TABS Take 5 mg by mouth 3 (three) times daily. (Patient taking differently: Take 5 mg by mouth 3 (three) times daily. Takes as needed)   buPROPion  (WELLBUTRIN  XL) 150 MG 24 hr tablet Take 150 mg by mouth every morning.   buPROPion  (WELLBUTRIN  XL) 300 MG 24 hr tablet Take 300 mg by mouth daily.   busPIRone  (BUSPAR ) 15 MG tablet Take 15 mg by mouth 3 (three) times daily.   clonazePAM (KLONOPIN) 0.5 MG tablet TAKE 1 2 TO 1 (ONE HALF TO ONE) TABLET BY MOUTH TWICE DAILY AS NEEDED FOR ANXIETY AND PANIC ATTACK   dicyclomine  (BENTYL ) 20 MG tablet Take 1 tablet (20 mg total) by mouth 3 (three) times daily before meals.   naproxen  (NAPROSYN ) 250 MG tablet Take 1 tablet (250 mg total) by mouth 2 (two) times daily as needed. Menstrual cramps.   spironolactone  (ALDACTONE ) 100 MG tablet Take 100 mg by mouth at bedtime.   spironolactone  (ALDACTONE ) 25 MG tablet Take 25 mg by mouth every morning.   Tavaborole  5 % SOLN Apply 1 Application topically  daily.   [DISCONTINUED] desvenlafaxine (PRISTIQ) 50 MG 24 hr tablet TAKE 1 TABLET BY MOUTH ONCE DAILY FOR ANXIETY   [DISCONTINUED] dicyclomine  (BENTYL ) 10 MG capsule Take 1 capsule (10 mg total) by mouth 4 (four) times daily -  before meals and at bedtime.   [DISCONTINUED] fexofenadine  (ALLEGRA ) 180 MG tablet Take by mouth.   [DISCONTINUED] Vitamin D , Ergocalciferol , (DRISDOL ) 1.25 MG (50000 UNIT) CAPS capsule Take 1 capsule (50,000 Units total) by mouth every 7 (seven) days.   fexofenadine  (ALLEGRA ) 180 MG tablet Take 1 tablet (180 mg total) by mouth daily as needed for allergies or rhinitis.   No facility-administered encounter medications  on file as of 04/05/2023.    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   Tinnitus    Saw ENT, assoc with emesis when occured    Past Surgical History:  Procedure Laterality Date   hemmorroid surgery  2024    Family History  Problem Relation Age of Onset   Diabetes Mother        Type 2   Irritable bowel syndrome Father    Lactose intolerance Father    Vitiligo Father    Alzheimer's disease Maternal Grandmother        Died at 75   Seizures Maternal Grandmother    Kidney disease Maternal Grandfather        Died at 82   Other Paternal Grandfather        Died at 22 from natural causes   Seizures Cousin        Maternal 1st Cousin    Social History   Socioeconomic History   Marital status: Single    Spouse name: Not on file   Number of children: 0   Years of education: Not on file   Highest education level: Bachelor's degree (e.g., BA, AB, BS)  Occupational History   Not on file  Tobacco Use   Smoking status: Never    Passive exposure: Never   Smokeless tobacco: Never  Vaping Use   Vaping status: Never Used  Substance and Sexual Activity   Alcohol use: No    Alcohol/week: 0.0 standard drinks of alcohol   Drug use: No   Sexual activity: Never  Other Topics Concern   Not on file  Social History Narrative   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    She lives with both parents.   Right handed      Social Drivers of Health   Financial Resource Strain: Low Risk  (06/23/2020)   Received from [employer]   Overall Financial Resource Strain (CARDIA)    Difficulty of Paying  Living Expenses: Not very hard  Food Insecurity: No Food Insecurity (06/23/2020)   Received from [employer]   Hunger Vital Sign    Worried About Running Out of Food in the Last Year: Never true    Ran Out of Food in the Last Year: Never true  Transportation Needs: No Transportation Needs (06/23/2020)   Received from [employer] [facility]   PRAPARE - Administrator, Civil Service (Medical):  No    Lack of Transportation (Non-Medical): No  Physical Activity: Inactive (06/23/2020)   Received from [facility]   Exercise Vital Sign    Days of Exercise per Week: 0 days    Minutes of Exercise per Session: 0 min  Stress: Stress Concern Present (06/23/2020)   Received from [facility]   [employer] of Occupational Health - Occupational Stress Questionnaire    Feeling of Stress : Rather much  Social Connections: Unknown (06/23/2020)   Received from [employer]   Social Connection and Isolation Panel [NHANES]    Frequency of Communication with Friends and Family: Patient declined    Frequency of Social Gatherings with Friends and Family: Patient declined    Attends Religious Services: Patient declined    Database Administrator or Organizations: Patient declined    Attends Banker Meetings: Patient declined    Marital Status: Patient declined  Intimate Partner Violence: Not At Risk (06/23/2020)   Received from [employer], Afraid, Rape, and Kick questionnaire    Fear of Current or Ex-Partner: No    Emotionally Abused: No    Physically Abused: No    Sexually Abused: No    Review of Systems  Constitutional:  Negative for chills and fever.  Respiratory:  Negative for shortness of breath.   Cardiovascular:  Negative for chest pain.  Gastrointestinal:  Negative for abdominal pain, constipation, diarrhea, heartburn, nausea and vomiting.  Genitourinary:  Negative for dysuria, frequency  and urgency.  Neurological:  Negative for dizziness and headaches.  Endo/Heme/Allergies:  Negative for polydipsia.  Psychiatric/Behavioral:  Negative for depression and suicidal ideas. The patient is not nervous/anxious.         Objective    BP 106/72   Pulse 79   Temp 98.5 F (36.9 C) (Oral)   Ht 5' 0.5 (1.537 m)   Wt 114 lb 3.2 oz (51.8 kg)   LMP 03/27/2023   SpO2 97%   BMI 21.94 kg/m   Physical Exam Vitals and nursing note reviewed.  Constitutional:      Appearance: Normal appearance.  HENT:     Right Ear: Tympanic membrane, ear canal and external ear normal.     Left Ear: Tympanic membrane, ear canal and external ear normal.  Cardiovascular:     Rate and Rhythm: Normal rate and regular rhythm.     Pulses: Normal pulses.     Heart sounds: Normal heart sounds.  Pulmonary:     Effort: Pulmonary effort is normal.     Breath sounds: Normal breath sounds.  Abdominal:     General: Bowel sounds are normal. There is no distension.     Palpations: There is no mass.     Tenderness: There is no abdominal tenderness. There is no guarding or rebound.     Hernia: No hernia is present.  Skin:    General: Skin is warm.     Capillary Refill: Capillary refill takes less than 2 seconds.     Comments: Toe nail fungus on 5th digit bilaterally. See picture.  Neurological:     General: No focal deficit present.     Mental Status: She is alert and oriented to person, place, and time.  Psychiatric:        Mood and Affect: Mood normal.        Behavior: Behavior normal.        Thought Content: Thought content normal.  Judgment: Judgment normal.            Assessment & Plan:  Irritable bowel syndrome with diarrhea Assessment & Plan: Patient stated symptoms well managed with Dicyclomine  HCL 20 mg with meals.   Refill sent.   CMP and CBC pending.  Orders: -     Comprehensive metabolic panel -     Dicyclomine  HCl; Take 1 tablet (20 mg total) by mouth 3 (three)  times daily before meals.  Dispense: 90 tablet; Refill: 1 -     CBC  Chronic idiopathic urticaria Assessment & Plan: Controlled  Improved since she moved from [place] .   Refill sent for Allegra  180 mg PRN.   Orders: -     Fexofenadine  HCl; Take 1 tablet (180 mg total) by mouth daily as needed for allergies or rhinitis.  Dispense: 90 tablet; Refill: 0  Dysmenorrhea Assessment & Plan: Controlled.   Refill sent for Naproxen  250 mg twice daily as needed for cramps.   Orders: -     Naproxen ; Take 1 tablet (250 mg total) by mouth 2 (two) times daily as needed. Menstrual cramps.  Dispense: 60 tablet; Refill: 0  Onychomycosis Assessment & Plan: Improving.   Refill sent for Tavaborole  5% solution.   Consider podiatry referral if not improving.   Orders: -     Tavaborole ; Apply 1 Application topically daily.  Dispense: 10 mL; Refill: 0  Encounter to establish care with new doctor Assessment & Plan: EMR reviewed briefly.   Vitamin D  insufficiency Assessment & Plan: Vitamin d  level pending.  Orders: -     VITAMIN D  25 Hydroxy (Vit-D Deficiency, Fractures)  Acne, unspecified acne type Assessment & Plan: Hx of acne.   No flare ups today.   Continue spirolactone 125 mg daily at  bedtime.   Referral for dermatology placed.  Orders: -     Ambulatory referral to Dermatology  Need for hepatitis C screening test -     Hepatitis C antibody  Major depressive disorder, recurrent, severe without psychotic features (HCC) Assessment & Plan: Controlled.   Followed by psychiatry.     04/05/2023   10:07 AM 02/18/2023   11:52 AM 02/11/2023   10:32 AM 02/04/2023   12:25 PM 01/28/2023   10:42 AM  Depression screen PHQ 2/9  Decreased Interest 0      Down, Depressed, Hopeless 0      PHQ - 2 Score 0      Altered sleeping 1      Tired, decreased energy 1      Change in appetite 1      Feeling bad or failure about yourself  0      Trouble concentrating 1      Moving slowly  or fidgety/restless 0      Suicidal thoughts 0      PHQ-9 Score 4      Difficult doing work/chores Somewhat difficult         Information is confidential and restricted. Go to Review Flowsheets to unlock data.      Anxiety and depression Assessment & Plan: Controlled.   Followed by psychiatry.      04/05/2023   10:07 AM  GAD 7 : Generalized Anxiety Score  Nervous, Anxious, on Edge 2  Control/stop worrying 2  Worry too much - different things 2  Trouble relaxing 1  Restless 1  Easily annoyed or irritable 0  Afraid - awful might happen 2  Total GAD 7 Score 10  Anxiety Difficulty  Very difficult          Return in about 2 weeks (around 04/19/2023) for discuss immune health.   Carrol Aurora, NP

## 2023-04-05 NOTE — Assessment & Plan Note (Signed)
 EMR reviewed briefly.

## 2023-04-05 NOTE — Addendum Note (Signed)
 Addended by: Modesto Charon on: 04/05/2023 03:32 PM   Modules accepted: Orders

## 2023-04-05 NOTE — Assessment & Plan Note (Signed)
 Hx of acne.   No flare ups today.   Continue spirolactone 125 mg daily at  bedtime.   Referral for dermatology placed.

## 2023-04-05 NOTE — Assessment & Plan Note (Addendum)
 Controlled.   Refill sent for Naproxen 250 mg twice daily as needed for cramps.

## 2023-04-05 NOTE — Assessment & Plan Note (Signed)
 Improving.   Refill sent for Tavaborole 5% solution.   Consider podiatry referral if not improving.

## 2023-04-05 NOTE — Telephone Encounter (Signed)
 Spoke with patient and verified dob. Patient agrees with sending in a covered alternative.

## 2023-04-06 ENCOUNTER — Encounter: Payer: Self-pay | Admitting: General Practice

## 2023-04-06 DIAGNOSIS — E559 Vitamin D deficiency, unspecified: Secondary | ICD-10-CM

## 2023-04-06 LAB — HEPATITIS C ANTIBODY: Hepatitis C Ab: NONREACTIVE

## 2023-04-08 DIAGNOSIS — F331 Major depressive disorder, recurrent, moderate: Secondary | ICD-10-CM | POA: Diagnosis not present

## 2023-04-08 DIAGNOSIS — F4312 Post-traumatic stress disorder, chronic: Secondary | ICD-10-CM | POA: Diagnosis not present

## 2023-04-08 MED ORDER — VITAMIN D (ERGOCALCIFEROL) 1.25 MG (50000 UNIT) PO CAPS: 50000.0000 [IU] | ORAL_CAPSULE | ORAL | 0 refills | Status: DC

## 2023-04-08 NOTE — Telephone Encounter (Signed)
 RX needs PA; do you wanna change to something else or send for PA to be done?

## 2023-04-15 DIAGNOSIS — F331 Major depressive disorder, recurrent, moderate: Secondary | ICD-10-CM | POA: Diagnosis not present

## 2023-04-15 DIAGNOSIS — F4312 Post-traumatic stress disorder, chronic: Secondary | ICD-10-CM | POA: Diagnosis not present

## 2023-04-15 MED ORDER — CICLOPIROX OLAMINE 0.77 % EX CREA: TOPICAL_CREAM | Freq: Two times a day (BID) | CUTANEOUS | 0 refills | Status: DC

## 2023-04-15 NOTE — Telephone Encounter (Signed)
 New RX sent

## 2023-04-15 NOTE — Telephone Encounter (Signed)
 Patient is needing a pa for tavaborole 5 percent topical solution needs a pa and the other medication and the alternative medication as well

## 2023-04-22 DIAGNOSIS — F4312 Post-traumatic stress disorder, chronic: Secondary | ICD-10-CM | POA: Diagnosis not present

## 2023-04-22 DIAGNOSIS — F331 Major depressive disorder, recurrent, moderate: Secondary | ICD-10-CM | POA: Diagnosis not present

## 2023-04-24 ENCOUNTER — Ambulatory Visit: Payer: Commercial Managed Care - PPO | Admitting: General Practice

## 2023-04-24 ENCOUNTER — Encounter: Payer: Self-pay | Admitting: General Practice

## 2023-04-29 DIAGNOSIS — F4312 Post-traumatic stress disorder, chronic: Secondary | ICD-10-CM | POA: Diagnosis not present

## 2023-04-29 DIAGNOSIS — F331 Major depressive disorder, recurrent, moderate: Secondary | ICD-10-CM | POA: Diagnosis not present

## 2023-04-30 ENCOUNTER — Other Ambulatory Visit: Payer: Self-pay | Admitting: General Practice

## 2023-04-30 ENCOUNTER — Other Ambulatory Visit (HOSPITAL_COMMUNITY): Payer: Self-pay

## 2023-04-30 ENCOUNTER — Telehealth: Payer: Self-pay

## 2023-04-30 DIAGNOSIS — K580 Irritable bowel syndrome with diarrhea: Secondary | ICD-10-CM

## 2023-04-30 NOTE — Telephone Encounter (Signed)
Pharmacy Patient Advocate Encounter   Received notification from Patient Advice Request messages that prior authorization for Tavaborole 5% solution is required/requested.   Insurance verification completed.   The patient is insured through [facility] .   Per test claim: PA required; PA submitted to above mentioned insurance via CoverMyMeds Key/confirmation #/EOC [id number] Status is pending

## 2023-05-03 ENCOUNTER — Telehealth: Payer: Self-pay

## 2023-05-03 ENCOUNTER — Other Ambulatory Visit (HOSPITAL_COMMUNITY): Payer: Self-pay

## 2023-05-03 NOTE — Telephone Encounter (Signed)
 LMTCB

## 2023-05-03 NOTE — Telephone Encounter (Signed)
See TE note for further documentation. PA has been denied.

## 2023-05-03 NOTE — Telephone Encounter (Signed)
Pharmacy Patient Advocate Encounter  Received notification from [facility] that Prior Authorization for Tavaborole 5% solution has been DENIED.  Full denial letter will be uploaded to the media tab. See denial reason below.   PA #/Case ID/Reference #: [id number]

## 2023-05-03 NOTE — Telephone Encounter (Signed)
*  Primary  Pharmacy Patient Advocate Encounter  Received notification from [facility] that Prior Authorization for Jublia 10% solution  has been DENIED.  Medication is not covered and is excluded from coverage.    PA #/Case ID/Reference #: [id number]

## 2023-05-06 DIAGNOSIS — F331 Major depressive disorder, recurrent, moderate: Secondary | ICD-10-CM | POA: Diagnosis not present

## 2023-05-06 DIAGNOSIS — F4312 Post-traumatic stress disorder, chronic: Secondary | ICD-10-CM | POA: Diagnosis not present

## 2023-05-13 ENCOUNTER — Ambulatory Visit: Payer: Commercial Managed Care - PPO | Admitting: General Practice

## 2023-05-13 DIAGNOSIS — F331 Major depressive disorder, recurrent, moderate: Secondary | ICD-10-CM | POA: Diagnosis not present

## 2023-05-13 DIAGNOSIS — F4312 Post-traumatic stress disorder, chronic: Secondary | ICD-10-CM | POA: Diagnosis not present

## 2023-05-20 DIAGNOSIS — F4312 Post-traumatic stress disorder, chronic: Secondary | ICD-10-CM | POA: Diagnosis not present

## 2023-05-20 DIAGNOSIS — F331 Major depressive disorder, recurrent, moderate: Secondary | ICD-10-CM | POA: Diagnosis not present

## 2023-05-22 DIAGNOSIS — F411 Generalized anxiety disorder: Secondary | ICD-10-CM | POA: Diagnosis not present

## 2023-05-22 DIAGNOSIS — F4310 Post-traumatic stress disorder, unspecified: Secondary | ICD-10-CM | POA: Diagnosis not present

## 2023-05-22 DIAGNOSIS — F332 Major depressive disorder, recurrent severe without psychotic features: Secondary | ICD-10-CM | POA: Diagnosis not present

## 2023-05-28 ENCOUNTER — Encounter: Payer: Self-pay | Admitting: General Practice

## 2023-05-28 ENCOUNTER — Ambulatory Visit: Payer: Commercial Managed Care - PPO | Admitting: General Practice

## 2023-05-28 VITALS — BP 112/70 | HR 86 | Temp 98.1°F | Ht 60.5 in | Wt 115.0 lb

## 2023-05-28 DIAGNOSIS — L709 Acne, unspecified: Secondary | ICD-10-CM | POA: Diagnosis not present

## 2023-05-28 DIAGNOSIS — F332 Major depressive disorder, recurrent severe without psychotic features: Secondary | ICD-10-CM | POA: Diagnosis not present

## 2023-05-28 DIAGNOSIS — N946 Dysmenorrhea, unspecified: Secondary | ICD-10-CM

## 2023-05-28 DIAGNOSIS — R7303 Prediabetes: Secondary | ICD-10-CM | POA: Diagnosis not present

## 2023-05-28 DIAGNOSIS — R87629 Unspecified abnormal cytological findings in specimens from vagina: Secondary | ICD-10-CM

## 2023-05-28 LAB — FOLATE: Folate: 19.4 ng/mL (ref >5.9)

## 2023-05-28 LAB — TSH: TSH: 1.39 u[IU]/mL (ref 0.35–5.50)

## 2023-05-28 LAB — VITAMIN B12: Vitamin B-12: 205 pg/mL — ABNORMAL LOW (ref 211–911)

## 2023-05-28 LAB — MAGNESIUM: Magnesium: 1.9 mg/dL (ref 1.5–2.5)

## 2023-05-28 LAB — HEMOGLOBIN A1C: Hgb A1c MFr Bld: 5.7 % (ref 4.6–6.5)

## 2023-05-28 NOTE — Progress Notes (Signed)
 Established Patient Office Visit  Subjective   Patient ID: Bianca Forbes, female    DOB: [birth year]  Age: 28 y.o. MRN: [id number]  Chief Complaint  Patient presents with   Follow-up    HPI  Ranisha Allaire is a 28 year old female with past medical history of anxiety and depression, dysmenorrhea, IBS, acne presents today for a follow up to discuss immune health.   Anxiety and depression: followed by psychiatry and psychology. Currently managed Wellbutrin xl 300 mg once daily. She does increase it to 450 mg if she is have suicidal ideations. It is not often that she is taking 450 mg. Buspar 15 mg TID. She denies any SI/HI. Her psychiatrist recommended checking vitamin levels to make sure that there are no underlying cause as her depression medication is not as effective as it used to be. She has been using Kolonopin three times a week.   Immune health/prediabetes: Over the last three years ago, she has noticed that she has been having delayed healing. She noticed that it has taking longer to get heal if she has a bacterial infection or a fungal infection. Her dad was diagnosed with vitiligo and since then she feels like she has some kind of auto immune health issue. She was told in the past that she has prediabetes. She does not have any symptoms today.   Patient Active Problem List   Diagnosis Date Noted   Onychomycosis 04/05/2023   Chronic idiopathic urticaria 04/05/2023   Dysmenorrhea 04/05/2023   Acne 04/05/2023   Encounter to establish care with new doctor 04/05/2023   Major depressive disorder, recurrent, severe without psychotic features (HCC) 04/05/2023   Anxiety and depression 05/20/2019   Prediabetes 05/24/2015   Nexplanon in place 04/08/2014   Vitamin D insufficiency 02/01/2013   Irritable bowel syndrome 01/06/2013   Past Medical History:  Diagnosis Date   Depression    External hemorrhoids with complication 04/13/2021   GERD (gastroesophageal reflux disease)     Headache(784.0)    Irritable bowel syndrome    Prediabetes 05/24/2015   Prolapsed internal hemorrhoids, grade 2 04/13/2021   Status post hemorrhoidectomy 12/12/2021   Tinnitus    Saw ENT, assoc with emesis when occured   No Known Allergies       05/28/2023    9:34 AM 04/05/2023   10:07 AM 02/18/2023   11:52 AM  Depression screen PHQ 2/9  Decreased Interest 3 0   Down, Depressed, Hopeless 3 0   PHQ - 2 Score 6 0   Altered sleeping 2 1   Tired, decreased energy 2 1   Change in appetite 3 1   Feeling bad or failure about yourself  1 0   Trouble concentrating 1 1   Moving slowly or fidgety/restless 0 0   Suicidal thoughts 0 0   PHQ-9 Score 15 4   Difficult doing work/chores Very difficult Somewhat difficult      Information is confidential and restricted. Go to Review Flowsheets to unlock data.       05/28/2023    9:34 AM 04/05/2023   10:07 AM  GAD 7 : Generalized Anxiety Score  Nervous, Anxious, on Edge 2 2  Control/stop worrying 1 2  Worry too much - different things 0 2  Trouble relaxing 2 1  Restless 0 1  Easily annoyed or irritable 0 0  Afraid - awful might happen 2 2  Total GAD 7 Score 7 10  Anxiety Difficulty Very difficult Very difficult  Review of Systems  Constitutional:  Negative for chills and fever.  Respiratory:  Negative for shortness of breath.   Cardiovascular:  Negative for chest pain.  Gastrointestinal:  Negative for abdominal pain, constipation, diarrhea, heartburn, nausea and vomiting.  Genitourinary:  Negative for dysuria, frequency and urgency.  Neurological:  Negative for dizziness and headaches.  Endo/Heme/Allergies:  Negative for polydipsia.  Psychiatric/Behavioral:  Positive for depression. Negative for suicidal ideas. The patient is nervous/anxious.        Denies SI/HI      Objective:     BP 112/70 (BP Location: Left Arm, Patient Position: Sitting, Cuff Size: Normal)   Pulse 86   Temp 98.1 F (36.7 C) (Oral)   Ht 5' 0.5"  (1.537 m)   Wt 115 lb (52.2 kg)   SpO2 98%   BMI 22.09 kg/m  BP Readings from Last 3 Encounters:  05/28/23 112/70  04/05/23 106/72  10/21/19 (!) 92/54   Wt Readings from Last 3 Encounters:  05/28/23 115 lb (52.2 kg)  04/05/23 114 lb 3.2 oz (51.8 kg)  10/21/19 112 lb 12.8 oz (51.2 kg)      Physical Exam Vitals and nursing note reviewed.  Constitutional:      Appearance: Normal appearance.  Cardiovascular:     Rate and Rhythm: Normal rate and regular rhythm.     Pulses: Normal pulses.     Heart sounds: Normal heart sounds.  Pulmonary:     Effort: Pulmonary effort is normal.     Breath sounds: Normal breath sounds.  Skin:    General: Skin is warm.  Neurological:     Mental Status: She is alert and oriented to person, place, and time.  Psychiatric:        Mood and Affect: Mood normal.        Behavior: Behavior normal.        Thought Content: Thought content normal.        Judgment: Judgment normal.      No results found for any visits on 05/28/23.     The ASCVD Risk score (Arnett DK, et al., 2019) failed to calculate for the following reasons:   The 2019 ASCVD risk score is only valid for ages 71 to 85    Assessment & Plan:  Prediabetes Assessment & Plan: Hx of prediabetes.  Discussed symptoms at length and will check A1c to rule out diabetes. If her symptoms could be contributing to the delayed healing.  Await results.  Orders: -     Hemoglobin A1c  Major depressive disorder, recurrent, severe without psychotic features (HCC) Assessment & Plan: Controlled however deteriorating.   Has discussed with psychiatrist.   Would like to rule out underlying cause.   Labs pending.  Denies SI/HI.   Orders: -     TSH -     Folate -     Vitamin B12 -     Magnesium  Dysmenorrhea -     Ambulatory referral to Gynecology  Abnormal vaginal Pap smear -     Ambulatory referral to Gynecology  Acne, unspecified acne type -     Ambulatory referral to  Dermatology     Return in about 2 months (around 07/26/2023) for general health.    Modesto Charon, NP

## 2023-05-28 NOTE — Assessment & Plan Note (Signed)
 Hx of prediabetes.  Discussed symptoms at length and will check A1c to rule out diabetes. If her symptoms could be contributing to the delayed healing.  Await results.

## 2023-05-28 NOTE — Patient Instructions (Addendum)
 Stop by the lab prior to leaving today. I will notify you of your results once received.   Continue medications as prescribed.   Referrals placed for dermatology and gyn.   Follow up in 2 months .

## 2023-05-28 NOTE — Assessment & Plan Note (Signed)
 Controlled however deteriorating.   Has discussed with psychiatrist.   Would like to rule out underlying cause.   Labs pending.  Denies SI/HI.

## 2023-06-10 DIAGNOSIS — F4312 Post-traumatic stress disorder, chronic: Secondary | ICD-10-CM | POA: Diagnosis not present

## 2023-06-10 DIAGNOSIS — F331 Major depressive disorder, recurrent, moderate: Secondary | ICD-10-CM | POA: Diagnosis not present

## 2023-06-20 DIAGNOSIS — F4312 Post-traumatic stress disorder, chronic: Secondary | ICD-10-CM | POA: Diagnosis not present

## 2023-06-20 DIAGNOSIS — F331 Major depressive disorder, recurrent, moderate: Secondary | ICD-10-CM | POA: Diagnosis not present

## 2023-06-24 DIAGNOSIS — F4312 Post-traumatic stress disorder, chronic: Secondary | ICD-10-CM | POA: Diagnosis not present

## 2023-06-24 DIAGNOSIS — F331 Major depressive disorder, recurrent, moderate: Secondary | ICD-10-CM | POA: Diagnosis not present

## 2023-07-01 DIAGNOSIS — F4312 Post-traumatic stress disorder, chronic: Secondary | ICD-10-CM | POA: Diagnosis not present

## 2023-07-01 DIAGNOSIS — F331 Major depressive disorder, recurrent, moderate: Secondary | ICD-10-CM | POA: Diagnosis not present

## 2023-07-08 ENCOUNTER — Other Ambulatory Visit: Payer: Self-pay | Admitting: General Practice

## 2023-07-08 DIAGNOSIS — F4312 Post-traumatic stress disorder, chronic: Secondary | ICD-10-CM | POA: Diagnosis not present

## 2023-07-08 DIAGNOSIS — F331 Major depressive disorder, recurrent, moderate: Secondary | ICD-10-CM | POA: Diagnosis not present

## 2023-07-08 NOTE — Telephone Encounter (Signed)
 We have not refilled these for patient yet; okay to refill?

## 2023-07-08 NOTE — Telephone Encounter (Signed)
 Copied from CRM [id number]. Topic: Clinical - Medication Refill >> Jul 08, 2023 10:02 AM Shelah Lewandowsky wrote: Most Recent Primary Care Visit:  Provider: Modesto Charon  Department: [facility]  Visit Type: OFFICE VISIT  Date: 05/28/2023  Medication: spironolactone (ALDACTONE) 100 MG tablet spironolactone (ALDACTONE) 25 MG tablet  Has the patient contacted their pharmacy? No (Agent: If no, request that the patient contact the pharmacy for the refill. If patient does not wish to contact the pharmacy document the reason why and proceed with request.) (Agent: If yes, when and what did the pharmacy advise?)  Is this the correct pharmacy for this prescription? Yes If no, delete pharmacy and type the correct one.  This is the patient's preferred pharmacy:  [phone],  - [address] [place] [address] Phone: [phone] Fax: [phone]   Has the prescription been filled recently? Yes  Is the patient out of the medication? No  Has the patient been seen for an appointment in the last year OR does the patient have an upcoming appointment? Yes  Can we respond through MyChart? Yes  Agent: Please be advised that Rx refills may take up to 3 business days. We ask that you follow-up with your pharmacy.

## 2023-07-08 NOTE — Telephone Encounter (Signed)
   I looked at this before I sent the request to you. Sounds like they have tried several times to get her then when they did she declined.

## 2023-07-09 MED ORDER — SPIRONOLACTONE 25 MG PO TABS: 25.0000 mg | ORAL_TABLET | Freq: Every morning | ORAL | 0 refills | Status: DC

## 2023-07-09 MED ORDER — SPIRONOLACTONE 100 MG PO TABS: 100.0000 mg | ORAL_TABLET | Freq: Every day | ORAL | 0 refills | Status: DC

## 2023-07-15 DIAGNOSIS — F411 Generalized anxiety disorder: Secondary | ICD-10-CM | POA: Diagnosis not present

## 2023-07-15 DIAGNOSIS — F331 Major depressive disorder, recurrent, moderate: Secondary | ICD-10-CM | POA: Diagnosis not present

## 2023-07-15 DIAGNOSIS — F4312 Post-traumatic stress disorder, chronic: Secondary | ICD-10-CM | POA: Diagnosis not present

## 2023-07-15 DIAGNOSIS — F4310 Post-traumatic stress disorder, unspecified: Secondary | ICD-10-CM | POA: Diagnosis not present

## 2023-07-15 DIAGNOSIS — F332 Major depressive disorder, recurrent severe without psychotic features: Secondary | ICD-10-CM | POA: Diagnosis not present

## 2023-07-24 DIAGNOSIS — F331 Major depressive disorder, recurrent, moderate: Secondary | ICD-10-CM | POA: Diagnosis not present

## 2023-07-24 DIAGNOSIS — F4312 Post-traumatic stress disorder, chronic: Secondary | ICD-10-CM | POA: Diagnosis not present

## 2023-07-29 DIAGNOSIS — F331 Major depressive disorder, recurrent, moderate: Secondary | ICD-10-CM | POA: Diagnosis not present

## 2023-07-29 DIAGNOSIS — F4312 Post-traumatic stress disorder, chronic: Secondary | ICD-10-CM | POA: Diagnosis not present

## 2023-08-05 DIAGNOSIS — F4312 Post-traumatic stress disorder, chronic: Secondary | ICD-10-CM | POA: Diagnosis not present

## 2023-08-05 DIAGNOSIS — F331 Major depressive disorder, recurrent, moderate: Secondary | ICD-10-CM | POA: Diagnosis not present

## 2023-08-12 DIAGNOSIS — F331 Major depressive disorder, recurrent, moderate: Secondary | ICD-10-CM | POA: Diagnosis not present

## 2023-08-12 DIAGNOSIS — F4312 Post-traumatic stress disorder, chronic: Secondary | ICD-10-CM | POA: Diagnosis not present

## 2023-08-21 DIAGNOSIS — F332 Major depressive disorder, recurrent severe without psychotic features: Secondary | ICD-10-CM | POA: Diagnosis not present

## 2023-08-21 DIAGNOSIS — F411 Generalized anxiety disorder: Secondary | ICD-10-CM | POA: Diagnosis not present

## 2023-08-24 DIAGNOSIS — F331 Major depressive disorder, recurrent, moderate: Secondary | ICD-10-CM | POA: Diagnosis not present

## 2023-08-24 DIAGNOSIS — F411 Generalized anxiety disorder: Secondary | ICD-10-CM | POA: Diagnosis not present

## 2023-08-28 DIAGNOSIS — F332 Major depressive disorder, recurrent severe without psychotic features: Secondary | ICD-10-CM | POA: Diagnosis not present

## 2023-08-28 DIAGNOSIS — F411 Generalized anxiety disorder: Secondary | ICD-10-CM | POA: Diagnosis not present

## 2023-08-29 DIAGNOSIS — F4389 Other reactions to severe stress: Secondary | ICD-10-CM | POA: Diagnosis not present

## 2023-08-31 DIAGNOSIS — F331 Major depressive disorder, recurrent, moderate: Secondary | ICD-10-CM | POA: Diagnosis not present

## 2023-08-31 DIAGNOSIS — F411 Generalized anxiety disorder: Secondary | ICD-10-CM | POA: Diagnosis not present

## 2023-09-05 DIAGNOSIS — F332 Major depressive disorder, recurrent severe without psychotic features: Secondary | ICD-10-CM | POA: Diagnosis not present

## 2023-09-05 DIAGNOSIS — F411 Generalized anxiety disorder: Secondary | ICD-10-CM | POA: Diagnosis not present

## 2023-09-07 DIAGNOSIS — F411 Generalized anxiety disorder: Secondary | ICD-10-CM | POA: Diagnosis not present

## 2023-09-07 DIAGNOSIS — F331 Major depressive disorder, recurrent, moderate: Secondary | ICD-10-CM | POA: Diagnosis not present

## 2023-09-09 DIAGNOSIS — F4310 Post-traumatic stress disorder, unspecified: Secondary | ICD-10-CM | POA: Diagnosis not present

## 2023-09-09 DIAGNOSIS — F411 Generalized anxiety disorder: Secondary | ICD-10-CM | POA: Diagnosis not present

## 2023-09-09 DIAGNOSIS — F332 Major depressive disorder, recurrent severe without psychotic features: Secondary | ICD-10-CM | POA: Diagnosis not present

## 2023-09-12 DIAGNOSIS — F411 Generalized anxiety disorder: Secondary | ICD-10-CM | POA: Diagnosis not present

## 2023-09-12 DIAGNOSIS — F332 Major depressive disorder, recurrent severe without psychotic features: Secondary | ICD-10-CM | POA: Diagnosis not present

## 2023-09-16 DIAGNOSIS — F331 Major depressive disorder, recurrent, moderate: Secondary | ICD-10-CM | POA: Diagnosis not present

## 2023-09-16 DIAGNOSIS — F411 Generalized anxiety disorder: Secondary | ICD-10-CM | POA: Diagnosis not present

## 2023-09-23 DIAGNOSIS — F331 Major depressive disorder, recurrent, moderate: Secondary | ICD-10-CM | POA: Diagnosis not present

## 2023-09-23 DIAGNOSIS — F411 Generalized anxiety disorder: Secondary | ICD-10-CM | POA: Diagnosis not present

## 2023-09-25 DIAGNOSIS — F411 Generalized anxiety disorder: Secondary | ICD-10-CM | POA: Diagnosis not present

## 2023-09-25 DIAGNOSIS — F332 Major depressive disorder, recurrent severe without psychotic features: Secondary | ICD-10-CM | POA: Diagnosis not present

## 2023-09-28 DIAGNOSIS — F411 Generalized anxiety disorder: Secondary | ICD-10-CM | POA: Diagnosis not present

## 2023-09-28 DIAGNOSIS — F331 Major depressive disorder, recurrent, moderate: Secondary | ICD-10-CM | POA: Diagnosis not present

## 2023-10-02 DIAGNOSIS — F332 Major depressive disorder, recurrent severe without psychotic features: Secondary | ICD-10-CM | POA: Diagnosis not present

## 2023-10-02 DIAGNOSIS — F411 Generalized anxiety disorder: Secondary | ICD-10-CM | POA: Diagnosis not present

## 2023-10-03 ENCOUNTER — Encounter: Payer: Self-pay | Admitting: General Practice

## 2023-10-05 ENCOUNTER — Other Ambulatory Visit: Payer: Self-pay | Admitting: General Practice

## 2023-10-05 DIAGNOSIS — F411 Generalized anxiety disorder: Secondary | ICD-10-CM | POA: Diagnosis not present

## 2023-10-05 DIAGNOSIS — F331 Major depressive disorder, recurrent, moderate: Secondary | ICD-10-CM | POA: Diagnosis not present

## 2023-10-07 ENCOUNTER — Ambulatory Visit: Payer: Self-pay | Admitting: *Deleted

## 2023-10-07 ENCOUNTER — Telehealth: Payer: Self-pay | Admitting: General Practice

## 2023-10-07 DIAGNOSIS — F411 Generalized anxiety disorder: Secondary | ICD-10-CM | POA: Diagnosis not present

## 2023-10-07 DIAGNOSIS — F331 Major depressive disorder, recurrent, moderate: Secondary | ICD-10-CM | POA: Diagnosis not present

## 2023-10-07 NOTE — Telephone Encounter (Signed)
 I responded to the note and noted that she needs to be seen in office

## 2023-10-07 NOTE — Telephone Encounter (Signed)
See other TE note. 

## 2023-10-07 NOTE — Telephone Encounter (Signed)
 Since there is a request for new medication. Please scheduled an appointment

## 2023-10-07 NOTE — Telephone Encounter (Signed)
 Attempted to contact patient- no naswer- left message to call office   Copied from CRM [id number]. Topic: Clinical - Medication Question >> Oct 07, 2023  8:47 AM Bianca Forbes wrote: Reason for CRM: Patient called with a question regarding her current medication, ciclopirox  (LOPROX ) 0.77 % cream.  She stated she is not seeing results and asked whether she should continue taking it. She was previously prescribed Tavaborole  5 % SOLN, but her insurance did not cover it.  She is asking if she can possibly be prescribed Tavaborole  again and would like to know how to proceed-whether she needs to come in for a visit or if the provider can send the prescription directly to the pharmacy.

## 2023-10-07 NOTE — Telephone Encounter (Signed)
 Patient needs an appt per matt cable due to needing medication change request. Please call patient to schedule an appt.

## 2023-10-07 NOTE — Telephone Encounter (Signed)
 Pt called asking if she can possibly change from ciclopirox  (LOPROX ) 0.77 % cream back to Tavaborole  5 % SOLN? Pt states she doesn't have any reactions or bad side effects, she believes the new cream isn't working for her. Please advise. There's also a triage telephone note for pt. Wasn't able to edit/addend note. Call back # [phone].

## 2023-10-07 NOTE — Telephone Encounter (Signed)
 FYI Only or Action Required?: Action required by provider: request for appointment, medication refill request, and clinical question for provider.  Patient was last seen in primary care on 05/28/2023 by Vincente Shivers, NP. Called Nurse Triage reporting Medication Problem. Symptoms began today. Interventions attempted: Nothing. Symptoms are: unchanged.  Triage Disposition: See PCP When Office is Open (Within 3 Days)  Patient/caregiver understands and will follow disposition?: Yes  Reason for Disposition  Prescription request for new medicine (not a refill)  Answer Assessment - Initial Assessment Questions 1. DRUG NAME: What medicine do you need to have refilled?     Tavaborole , Requesting  3. EXPIRATION DATE: What is the expiration date? (Note: The label states when the prescription will expire, and thus can no longer be refilled.)     Unsure  4. PRESCRIBING HCP: Who prescribed it? Reason: If prescribed by specialist, call should be referred to that group.     Vincente Shivers, NP 5. SYMPTOMS: Do you have any symptoms?     None, Denies any symptoms  6. PREGNANCY: Is there any chance that you are pregnant? When was your last menstrual period?     No  Patient is inquiring that if the medication, Tavaborole  cannot be covered by her insurance that an alternative similar Tavaborole  be prescribed if possible.  Protocols used: Medication Refill and Renewal Call-A-AH

## 2023-10-09 ENCOUNTER — Encounter: Payer: Self-pay | Admitting: Primary Care

## 2023-10-09 ENCOUNTER — Ambulatory Visit: Admitting: Primary Care

## 2023-10-09 VITALS — BP 102/62 | HR 90 | Temp 97.3°F | Ht 60.5 in | Wt 109.0 lb

## 2023-10-09 DIAGNOSIS — B351 Tinea unguium: Secondary | ICD-10-CM | POA: Diagnosis not present

## 2023-10-09 DIAGNOSIS — F411 Generalized anxiety disorder: Secondary | ICD-10-CM | POA: Diagnosis not present

## 2023-10-09 DIAGNOSIS — F332 Major depressive disorder, recurrent severe without psychotic features: Secondary | ICD-10-CM | POA: Diagnosis not present

## 2023-10-09 MED ORDER — TERBINAFINE HCL 250 MG PO TABS
250.0000 mg | ORAL_TABLET | Freq: Every day | ORAL | 0 refills | Status: DC
Start: 2023-10-09 — End: 2023-10-24

## 2023-10-09 NOTE — Progress Notes (Signed)
 Subjective:    Patient ID: Bianca Forbes, female    DOB: [birth year], 28 y.o.   MRN: [id number]  HPI  Bianca Forbes is a very pleasant 28 y.o. female patient of  [facility] who presents today to discuss onychomycosis.  Originally evaluated and treated in [place]  per podiatry for left 5th digit onychomycosis and was prescribed tavaborole  topical 5% solution.  During her visit with her PCP in January 2025 she endorsed improvement with this treatment so her tavaborole  was refilled.  Unfortunately, tavaborole  was not covered by insurance so she was prescribed ciclopirox  77% solution.   Today she discusses that she's not noticed much improvement on since prescribed ciclopirox . She did run out recently. Her onychomycosis is located to the bilateral fifth toenails which is about the same since moving to  .  She underwent lab work per PCP in January 2025, normal liver enzymes.  She has no history of liver disease.   Review of Systems  Gastrointestinal:  Negative for abdominal pain.  Skin:  Negative for color change.       Onychomycosis         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   Tinnitus    Saw ENT, assoc with emesis when occured    Social History   Socioeconomic History   Marital status: Single    Spouse name: Not on file   Number of children: 0   Years of education: Not on file   Highest education level: Bachelor's degree (e.g., BA, AB, BS)  Occupational History   Not on file  Tobacco Use   Smoking status: Never    Passive exposure: Never   Smokeless tobacco: Never  Vaping Use   Vaping status: Never Used  Substance and Sexual Activity   Alcohol use: No    Alcohol/week: 0.0  standard drinks of alcohol   Drug use: No   Sexual activity: Never  Other Topics Concern   Not on file  Social History Narrative   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    She lives with both parents.   Right handed      Social Drivers of Health   Financial Resource Strain: Low Risk  (06/23/2020)   Received from [facility]   Overall Financial Resource Strain (CARDIA)    Difficulty of Paying Living Expenses: Not very hard  Food Insecurity: No Food Insecurity (06/23/2020)   Received from [facility]   Hunger Vital Sign    Within the past 12 months, you worried that your food would run out before you got the money to buy more.: Never true    Within the past 12 months, the food you bought just didn't last and you didn't have money to get more.: Never true  Transportation Needs: No Transportation Needs (06/23/2020)   Received from [facility]   PRAPARE - Transportation    Lack of Transportation (Medical): No    Lack of Transportation (Non-Medical): No  Physical Activity: Inactive (06/23/2020)   Received from [facility]   Exercise Vital Sign    On average,  how many days per week do you engage in moderate to strenuous exercise (like a brisk walk)?: 0 days    On average, how many minutes do you engage in exercise at this level?: 0 min  Stress: Stress Concern Present (06/23/2020)   Received from [facility]   [employer] of Occupational Health - Occupational Stress Questionnaire    Feeling of Stress : Rather much  Social Connections: Unknown (06/23/2020)   Received from [facility]   Social Connection and Isolation Panel    In a typical week, how many times do you talk on the phone with family, friends, or neighbors?: Patient declined    How often do you get together with friends or relatives?:  Patient declined    How often do you attend church or religious services?: Patient declined    Do you belong to any clubs or organizations such as church groups, unions, fraternal or athletic groups, or school groups?: Patient declined    How often do you attend meetings of the clubs or organizations you belong to?: Patient declined    Are you married, widowed, divorced, separated, never married, or living with a partner?: Patient declined  Intimate Partner Violence: Not At Risk (06/23/2020)   Received from [employer] [facility], Afraid, Rape, and Kick questionnaire    Within the last year, have you been afraid of your partner or ex-partner?: No    Within the last year, have you been humiliated or emotionally abused in other ways by your partner or ex-partner?: No    Within the last year, have you been kicked, hit, slapped, or otherwise physically hurt by your partner or ex-partner?: No    Within the last year, have you been raped or forced to have any kind of sexual activity by your partner or ex-partner?: No    Past Surgical History:  Procedure Laterality Date   hemmorroid surgery  2024    Family History  Problem Relation Age of Onset   Diabetes Mother        Type 2   Irritable bowel syndrome Father    Lactose intolerance Father    Vitiligo Father    Alzheimer's disease Maternal Grandmother        Died at 17   Seizures Maternal Grandmother    Kidney disease Maternal Grandfather        Died at 50   Other Paternal Grandfather        Died at 51 from natural causes   Seizures Cousin        Maternal 1st Cousin    No Known Allergies  Current Outpatient Medications on File Prior to Visit  Medication Sig Dispense Refill   buPROPion (WELLBUTRIN XL) 150 MG 24 hr tablet Take 150 mg by mouth every morning.     buPROPion (WELLBUTRIN XL) 300 MG 24 hr tablet Take 300 mg by mouth daily.  1   busPIRone (BUSPAR) 15 MG tablet Take 15 mg by mouth 3 (three) times daily.      ciclopirox  (LOPROX ) 0.77 % cream Apply topically 2 (two) times daily. 30 g 0   clonazePAM (KLONOPIN) 0.5 MG tablet TAKE 1 2 TO 1 (ONE HALF TO ONE) TABLET BY MOUTH TWICE DAILY AS NEEDED FOR ANXIETY AND PANIC ATTACK     dicyclomine  (BENTYL ) 20 MG tablet Take 1 tablet (20 mg total) by mouth 3 (three) times daily before meals. 90 tablet 1   Efinaconazole  10 % SOLN Apply 1 Application topically  daily. 8 mL 0   fexofenadine  (ALLEGRA ) 180 MG tablet Take 1 tablet (180 mg total) by mouth daily as needed for allergies or rhinitis. 90 tablet 0   naproxen  (NAPROSYN ) 250 MG tablet Take 1 tablet (250 mg total) by mouth 2 (two) times daily as needed. Menstrual cramps. 60 tablet 0   spironolactone  (ALDACTONE ) 100 MG tablet TAKE 1 TABLET BY MOUTH EVERYDAY AT BEDTIME 30 tablet 2   spironolactone  (ALDACTONE ) 25 MG tablet TAKE 1 TABLET BY MOUTH EVERY DAY IN THE MORNING 30 tablet 2   Vitamin D , Ergocalciferol , (DRISDOL ) 1.25 MG (50000 UNIT) CAPS capsule Take 1 capsule (50,000 Units total) by mouth every 7 (seven) days. 12 capsule 0   Baclofen  5 MG TABS Take 5 mg by mouth 3 (three) times daily. (Patient not taking: Reported on 10/09/2023) 90 tablet 3   No current facility-administered medications on file prior to visit.    BP 102/62   Pulse 90   Temp (!) 97.3 F (36.3 C) (Temporal)   Ht 5' 0.5 (1.537 m)   Wt 109 lb (49.4 kg)   LMP 09/25/2023   SpO2 98%   BMI 20.94 kg/m  Objective:   Physical Exam Cardiovascular:     Rate and Rhythm: Normal rate and regular rhythm.  Pulmonary:     Effort: Pulmonary effort is normal.     Breath sounds: Normal breath sounds.  Musculoskeletal:     Cervical back: Neck supple.  Skin:    General: Skin is warm and dry.     Comments: Thickened toenails noted to most nails.  Onychomycosis noted to bilateral fifth toenails.  Neurological:     Mental Status: She is alert and oriented to person, place, and time.  Psychiatric:        Mood and Affect: Mood normal.            Assessment & Plan:  Onychomycosis Assessment & Plan: Evident on exam.  Given lack of resolve with 2 topical solutions, will trial terbinafine .   Start terbinafine  250 mg once daily for 1 to 3 months. Reviewed liver enzymes from January 2025.  Also reviewed liver enzymes from prior years.  She will follow-up with PCP in 3 weeks for reevaluation and repeat LFTs.  Orders: -     Terbinafine  HCl; Take 1 tablet (250 mg total) by mouth daily. For toenail fungus  Dispense: 30 tablet; Refill: 0        Comer MARLA Gaskins, NP

## 2023-10-09 NOTE — Assessment & Plan Note (Signed)
 Evident on exam.  Given lack of resolve with 2 topical solutions, will trial terbinafine .   Start terbinafine  250 mg once daily for 1 to 3 months. Reviewed liver enzymes from January 2025.  Also reviewed liver enzymes from prior years.  She will follow-up with PCP in 3 weeks for reevaluation and repeat LFTs.

## 2023-10-09 NOTE — Patient Instructions (Signed)
 Start terbinafine  250 mg once daily for panel fungus.  Schedule a follow-up visit with [facility] for 3 weeks for reevaluation.  It was a pleasure meeting you!

## 2023-10-11 DIAGNOSIS — F331 Major depressive disorder, recurrent, moderate: Secondary | ICD-10-CM | POA: Diagnosis not present

## 2023-10-11 DIAGNOSIS — F411 Generalized anxiety disorder: Secondary | ICD-10-CM | POA: Diagnosis not present

## 2023-10-14 DIAGNOSIS — F411 Generalized anxiety disorder: Secondary | ICD-10-CM | POA: Diagnosis not present

## 2023-10-14 DIAGNOSIS — F332 Major depressive disorder, recurrent severe without psychotic features: Secondary | ICD-10-CM | POA: Diagnosis not present

## 2023-10-17 DIAGNOSIS — F331 Major depressive disorder, recurrent, moderate: Secondary | ICD-10-CM | POA: Diagnosis not present

## 2023-10-17 DIAGNOSIS — F411 Generalized anxiety disorder: Secondary | ICD-10-CM | POA: Diagnosis not present

## 2023-10-21 DIAGNOSIS — F332 Major depressive disorder, recurrent severe without psychotic features: Secondary | ICD-10-CM | POA: Diagnosis not present

## 2023-10-21 DIAGNOSIS — F411 Generalized anxiety disorder: Secondary | ICD-10-CM | POA: Diagnosis not present

## 2023-10-24 ENCOUNTER — Ambulatory Visit: Payer: Self-pay | Admitting: Primary Care

## 2023-10-24 ENCOUNTER — Ambulatory Visit (INDEPENDENT_AMBULATORY_CARE_PROVIDER_SITE_OTHER): Admitting: Primary Care

## 2023-10-24 ENCOUNTER — Encounter: Payer: Self-pay | Admitting: Primary Care

## 2023-10-24 VITALS — BP 110/62 | HR 66 | Temp 97.3°F | Ht 60.5 in | Wt 111.0 lb

## 2023-10-24 DIAGNOSIS — M62838 Other muscle spasm: Secondary | ICD-10-CM

## 2023-10-24 DIAGNOSIS — B351 Tinea unguium: Secondary | ICD-10-CM

## 2023-10-24 DIAGNOSIS — F331 Major depressive disorder, recurrent, moderate: Secondary | ICD-10-CM | POA: Diagnosis not present

## 2023-10-24 DIAGNOSIS — F411 Generalized anxiety disorder: Secondary | ICD-10-CM | POA: Diagnosis not present

## 2023-10-24 LAB — HEPATIC FUNCTION PANEL
ALT: 11 U/L (ref 0–35)
AST: 16 U/L (ref 0–37)
Albumin: 4.4 g/dL (ref 3.5–5.2)
Alkaline Phosphatase: 30 U/L — ABNORMAL LOW (ref 39–117)
Bilirubin, Direct: 0.1 mg/dL (ref 0.0–0.3)
Total Bilirubin: 0.5 mg/dL (ref 0.2–1.2)
Total Protein: 7.8 g/dL (ref 6.0–8.3)

## 2023-10-24 MED ORDER — BACLOFEN 10 MG PO TABS: 5.0000 mg | ORAL_TABLET | Freq: Every day | ORAL | 0 refills | Status: AC | PRN

## 2023-10-24 MED ORDER — TERBINAFINE HCL 250 MG PO TABS: 250.0000 mg | ORAL_TABLET | Freq: Every day | ORAL | 0 refills | Status: DC

## 2023-10-24 NOTE — Patient Instructions (Signed)
 Stop by the lab prior to leaving today. I will notify you of your results once received.   Continue terbinafine  250 mg once daily for toenail fungus.  It was a pleasure to see you today!

## 2023-10-24 NOTE — Progress Notes (Signed)
 Subjective:    Patient ID: Bianca Forbes, female    DOB: 05/13/1995, 28 y.o.   MRN: [id number]  HPI  Bianca Forbes is a very pleasant 28 y.o. female patient of [place], NP with a history of anxiety and depression, IBS, onychomycosis who presents today for follow-up of onychomycosis.  She was last evaluated by me on 10/09/2023 for chronic onychomycosis to the bilateral 5th toenails despite trial of 2 prescription treatments.  Given this she was initiated on terbinafine  250 mg daily for 1 to 3 months.  She is here today for follow-up.  Since her last visit she's not noticed any improvement to her onychomycosis.  She has been compliant to her terbinafine  daily as prescribed.  She has noticed increased muscle spasms to her hands which is a chronic issue.  She would like a refill of her baclofen .   Review of Systems  Musculoskeletal:        Muscle spasms to hands  Skin:        Onychomycosis         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   Tinnitus    Saw ENT, assoc with emesis when occured    Social History   Socioeconomic History   Marital status: Single    Spouse name: Not on file   Number of children: 0   Years of education: Not on file   Highest education level: Bachelor's degree (e.g., BA, AB, BS)  Occupational History   Not on file  Tobacco Use   Smoking status: Never    Passive exposure: Never   Smokeless tobacco: Never  Vaping Use   Vaping status: Never Used  Substance and Sexual Activity   Alcohol use: No    Alcohol/week: 0.0 standard drinks of alcohol   Drug use: No   Sexual activity: Never  Other Topics Concern   Not on file  Social History Narrative   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    She lives with both parents.   Right handed      Social Drivers of Health   Financial Resource Strain: Low Risk  (06/23/2020)   Received from [facility]   Overall Financial Resource Strain (CARDIA)    Difficulty of Paying Living Expenses: Not very hard  Food Insecurity: No Food Insecurity (06/23/2020)   Received from [facility]   Hunger Vital Sign    Within the past 12 months, you worried that your food would run out before you got the money to buy more.: Never true    Within the past 12 months, the food you bought just didn't last and you didn't have money to get more.: Never true  Transportation Needs: No Transportation Needs (06/23/2020)   Received from [facility]   PRAPARE - Transportation    Lack of Transportation (Medical): No    Lack of Transportation (Non-Medical): No  Physical Activity: Inactive (06/23/2020)   Received from [facility]   Exercise Vital Sign    On average, how many days per week do you engage in moderate to  strenuous exercise (like a brisk walk)?: 0 days    On average, how many minutes do you engage in exercise at this level?: 0 min  Stress: Stress Concern Present (06/23/2020)   Received from [facility]   [employer] of Occupational Health - Occupational Stress Questionnaire    Feeling of Stress : Rather much  Social Connections: Unknown (06/23/2020)   Received from [facility]   Social Connection and Isolation Panel    In a typical week, how many times do you talk on the phone with family, friends, or neighbors?: Patient declined    How often do you get together with friends or relatives?: Patient declined    How often do you attend church or religious services?: Patient declined    Do you belong to any clubs or organizations such as church  groups, unions, fraternal or athletic groups, or school groups?: Patient declined    How often do you attend meetings of the clubs or organizations you belong to?: Patient declined    Are you married, widowed, divorced, separated, never married, or living with a partner?: Patient declined  Intimate Partner Violence: Not At Risk (06/23/2020)   Received from [employer] [facility], Afraid, Rape, and Kick questionnaire    Within the last year, have you been afraid of your partner or ex-partner?: No    Within the last year, have you been humiliated or emotionally abused in other ways by your partner or ex-partner?: No    Within the last year, have you been kicked, hit, slapped, or otherwise physically hurt by your partner or ex-partner?: No    Within the last year, have you been raped or forced to have any kind of sexual activity by your partner or ex-partner?: No    Past Surgical History:  Procedure Laterality Date   hemmorroid surgery  2024    Family History  Problem Relation Age of Onset   Diabetes Mother        Type 2   Irritable bowel syndrome Father    Lactose intolerance Father    Vitiligo Father    Alzheimer's disease Maternal Grandmother        Died at 106   Seizures Maternal Grandmother    Kidney disease Maternal Grandfather        Died at 81   Other Paternal Grandfather        Died at 60 from natural causes   Seizures Cousin        Maternal 1st Cousin    No Known Allergies  Current Outpatient Medications on File Prior to Visit  Medication Sig Dispense Refill   buPROPion (WELLBUTRIN XL) 150 MG 24 hr tablet Take 150 mg by mouth every morning.     buPROPion (WELLBUTRIN XL) 300 MG 24 hr tablet Take 300 mg by mouth daily.  1   busPIRone (BUSPAR) 15 MG tablet Take 15 mg by mouth 3 (three) times daily.     ciclopirox  (LOPROX ) 0.77 % cream Apply topically 2 (two) times daily. 30 g 0   clonazePAM (KLONOPIN) 0.5 MG tablet TAKE 1 2 TO 1 (ONE HALF TO ONE) TABLET BY  MOUTH TWICE DAILY AS NEEDED FOR ANXIETY AND PANIC ATTACK     dicyclomine  (BENTYL ) 20 MG tablet Take 1 tablet (20 mg total) by mouth 3 (three) times daily before meals. 90 tablet 1   Efinaconazole  10 % SOLN Apply 1 Application topically daily. 8 mL 0   fexofenadine  (ALLEGRA ) 180 MG tablet  Take 1 tablet (180 mg total) by mouth daily as needed for allergies or rhinitis. 90 tablet 0   naproxen  (NAPROSYN ) 250 MG tablet Take 1 tablet (250 mg total) by mouth 2 (two) times daily as needed. Menstrual cramps. 60 tablet 0   spironolactone  (ALDACTONE ) 100 MG tablet TAKE 1 TABLET BY MOUTH EVERYDAY AT BEDTIME 30 tablet 2   spironolactone  (ALDACTONE ) 25 MG tablet TAKE 1 TABLET BY MOUTH EVERY DAY IN THE MORNING 30 tablet 2   Vitamin D , Ergocalciferol , (DRISDOL ) 1.25 MG (50000 UNIT) CAPS capsule Take 1 capsule (50,000 Units total) by mouth every 7 (seven) days. 12 capsule 0   No current facility-administered medications on file prior to visit.    BP 110/62   Pulse 66   Temp (!) 97.3 F (36.3 C) (Temporal)   Ht 5' 0.5 (1.537 m)   Wt 111 lb (50.3 kg)   LMP 10/21/2023   SpO2 99%   BMI 21.32 kg/m  Objective:   Physical Exam Cardiovascular:     Rate and Rhythm: Normal rate and regular rhythm.  Pulmonary:     Effort: Pulmonary effort is normal.     Breath sounds: Normal breath sounds.  Musculoskeletal:     Cervical back: Neck supple.  Skin:    General: Skin is warm and dry.     Comments: Evidence of onychomycosis to bilateral fifth toenails and several other toenails for which are more mild  Neurological:     Mental Status: She is alert and oriented to person, place, and time.  Psychiatric:        Mood and Affect: Mood normal.           Assessment & Plan:  Onychomycosis Assessment & Plan: Little to no improvement on exam today. We discussed that it can take up to 3 months for improvement of symptoms.  Liver enzymes pending today. Continue terbinafine  250 mg daily.  If no improvement  then will refer to podiatry.  She will update.  Orders: -     Hepatic function panel -     Terbinafine  HCl; Take 1 tablet (250 mg total) by mouth daily. For toenail fungus  Dispense: 30 tablet; Refill: 0  Muscle spasm -     Baclofen ; Take 0.5 tablets (5 mg total) by mouth daily as needed for muscle spasms.  Dispense: 10 each; Refill: 0        Comer MARLA Gaskins, NP

## 2023-10-24 NOTE — Assessment & Plan Note (Signed)
 Little to no improvement on exam today. We discussed that it can take up to 3 months for improvement of symptoms.  Liver enzymes pending today. Continue terbinafine  250 mg daily.  If no improvement then will refer to podiatry.  She will update.

## 2023-10-28 DIAGNOSIS — F332 Major depressive disorder, recurrent severe without psychotic features: Secondary | ICD-10-CM | POA: Diagnosis not present

## 2023-10-28 DIAGNOSIS — F411 Generalized anxiety disorder: Secondary | ICD-10-CM | POA: Diagnosis not present

## 2023-10-31 DIAGNOSIS — F331 Major depressive disorder, recurrent, moderate: Secondary | ICD-10-CM | POA: Diagnosis not present

## 2023-10-31 DIAGNOSIS — F411 Generalized anxiety disorder: Secondary | ICD-10-CM | POA: Diagnosis not present

## 2023-11-04 DIAGNOSIS — F331 Major depressive disorder, recurrent, moderate: Secondary | ICD-10-CM | POA: Diagnosis not present

## 2023-11-04 DIAGNOSIS — F411 Generalized anxiety disorder: Secondary | ICD-10-CM | POA: Diagnosis not present

## 2023-11-06 DIAGNOSIS — F332 Major depressive disorder, recurrent severe without psychotic features: Secondary | ICD-10-CM | POA: Diagnosis not present

## 2023-11-06 DIAGNOSIS — F411 Generalized anxiety disorder: Secondary | ICD-10-CM | POA: Diagnosis not present

## 2023-11-07 ENCOUNTER — Encounter: Payer: Self-pay | Admitting: General Practice

## 2023-11-07 ENCOUNTER — Ambulatory Visit (INDEPENDENT_AMBULATORY_CARE_PROVIDER_SITE_OTHER): Admitting: General Practice

## 2023-11-07 VITALS — BP 104/60 | HR 84 | Temp 98.6°F | Ht 60.5 in | Wt 114.0 lb

## 2023-11-07 DIAGNOSIS — F332 Major depressive disorder, recurrent severe without psychotic features: Secondary | ICD-10-CM | POA: Diagnosis not present

## 2023-11-07 DIAGNOSIS — F32A Depression, unspecified: Secondary | ICD-10-CM

## 2023-11-07 DIAGNOSIS — F419 Anxiety disorder, unspecified: Secondary | ICD-10-CM | POA: Diagnosis not present

## 2023-11-07 NOTE — Patient Instructions (Addendum)
 Call and make an appointment with psychiatry as soon as possible.   Please keep me updated about your FMLA papers.   I will see you next week.  It was a pleasure to see you today!

## 2023-11-07 NOTE — Assessment & Plan Note (Signed)
 Uncontrolled.  Discussed at length. No red flags on exam. Denies SI/HI.   Given that she is following with psychiatry and psychology, she will discuss FMLA paper work with them.  Agreed to provide work note to return to work on 11/15/23.   Patient will call and schedule appt with psychiatry.

## 2023-11-07 NOTE — Progress Notes (Signed)
 Established Patient Office Visit  Subjective   Patient ID: Bianca Forbes, female    DOB: 12-16-1995  Age: 28 y.o. MRN: [id number]  Chief Complaint  Patient presents with   FMLA    Would like to have FMLA filled out for stress related symptoms.     HPI  Bianca Forbes is a 28 year old female with past medical history of IBS, dysmenorrhea, vitamin d  insufficiency, MDD, prediabetes, anxiety and depression presents today to discuss FMLA.  Anxiety and depression: followed by psychiatry and psychology. Currently managed Wellbutrin xl 300 mg once daily. She does increase it to 450 mg if she is have suicidal ideations. It is not often that she is taking 450 mg. Buspar 15 mg TID. She denies any SI/HI. She has been having panic attacks 1-2 times a week. She takes Klonopin and usually helps but it hasn't helping. Her episode lasts for 30 minutes to several hours. She usually has ringing in the ear along with it. She has to rest and lay down in a quiet are to get any relief. She would even try to nap but it hasn't been helping. She has tried to call her psychiatrist to get in sooner. Her appointment is in the next few weeks. She would like a work note to return to work next week so she can get in to see psychiatry sooner. She denies SI/HI. She denies any abuse. She reports that her current stressors include family and work and she is not able to change the situation.    Patient Active Problem List   Diagnosis Date Noted   Onychomycosis 04/05/2023   Chronic idiopathic urticaria 04/05/2023   Dysmenorrhea 04/05/2023   Acne 04/05/2023   Encounter to establish care with new doctor 04/05/2023   Major depressive disorder, recurrent, severe without psychotic features (HCC) 04/05/2023   Anxiety and depression 05/20/2019   Prediabetes 05/24/2015   Nexplanon in place 04/08/2014   Vitamin D  insufficiency 02/01/2013   Irritable bowel syndrome 01/06/2013   Past Medical History:  Diagnosis Date    Depression    External hemorrhoids with complication 04/13/2021   GERD (gastroesophageal reflux disease)    Headache(784.0)    Irritable bowel syndrome    Prediabetes 05/24/2015   Prolapsed internal hemorrhoids, grade 2 04/13/2021   Status post hemorrhoidectomy 12/12/2021   Tinnitus    Saw ENT, assoc with emesis when occured   Past Surgical History:  Procedure Laterality Date   hemmorroid surgery  2024   No Known Allergies       11/07/2023   12:52 PM 10/24/2023   10:19 AM 05/28/2023    9:34 AM  Depression screen PHQ 2/9  Decreased Interest 2 2 3   Down, Depressed, Hopeless 3 2 3   PHQ - 2 Score 5 4 6   Altered sleeping 3 3 2   Tired, decreased energy 2 2 2   Change in appetite 3 3 3   Feeling bad or failure about yourself  1 0 1  Trouble concentrating 2 1 1   Moving slowly or fidgety/restless 0 1 0  Suicidal thoughts 0 0 0  PHQ-9 Score 16 14 15   Difficult doing work/chores Very difficult Not difficult at all Very difficult       11/07/2023   12:52 PM 05/28/2023    9:34 AM 04/05/2023   10:07 AM  GAD 7 : Generalized Anxiety Score  Nervous, Anxious, on Edge 2 2 2   Control/stop worrying 3 1 2   Worry too much - different things 2 0 2  Trouble relaxing 2 2 1   Restless 1 0 1  Easily annoyed or irritable 1 0 0  Afraid - awful might happen 3 2 2   Total GAD 7 Score 14 7 10   Anxiety Difficulty Very difficult Very difficult Very difficult      Review of Systems  Constitutional:  Negative for chills and fever.  Respiratory:  Negative for shortness of breath.   Cardiovascular:  Negative for chest pain.  Gastrointestinal:  Negative for abdominal pain, constipation, diarrhea, heartburn, nausea and vomiting.  Genitourinary:  Negative for dysuria, frequency and urgency.  Neurological:  Negative for dizziness and headaches.  Endo/Heme/Allergies:  Negative for polydipsia.  Psychiatric/Behavioral:  Negative for depression and suicidal ideas. The patient is nervous/anxious.        Objective:     BP 104/60   Pulse 84   Temp 98.6 F (37 C) (Oral)   Ht 5' 0.5 (1.537 m)   Wt 114 lb (51.7 kg)   LMP 10/21/2023   SpO2 98%   BMI 21.90 kg/m  BP Readings from Last 3 Encounters:  11/07/23 104/60  10/24/23 110/62  10/09/23 102/62   Wt Readings from Last 3 Encounters:  11/07/23 114 lb (51.7 kg)  10/24/23 111 lb (50.3 kg)  10/09/23 109 lb (49.4 kg)      Physical Exam Vitals and nursing note reviewed.  Constitutional:      Appearance: Normal appearance.  Cardiovascular:     Rate and Rhythm: Normal rate and regular rhythm.     Pulses: Normal pulses.     Heart sounds: Normal heart sounds.  Pulmonary:     Effort: Pulmonary effort is normal.     Breath sounds: Normal breath sounds.  Neurological:     Mental Status: She is alert and oriented to person, place, and time.  Psychiatric:        Mood and Affect: Mood normal.        Behavior: Behavior normal.        Thought Content: Thought content normal.        Judgment: Judgment normal.      No results found for any visits on 11/07/23.     The ASCVD Risk score (Arnett DK, et al., 2019) failed to calculate for the following reasons:   The 2019 ASCVD risk score is only valid for ages 29 to 37    Assessment & Plan:  Major depressive disorder, recurrent, severe without psychotic features (HCC)  Anxiety and depression Assessment & Plan: Uncontrolled.  Discussed at length. No red flags on exam. Denies SI/HI.   Given that she is following with psychiatry and psychology, she will discuss FMLA paper work with them.  Agreed to provide work note to return to work on 11/15/23.   Patient will call and schedule appt with psychiatry.     Return if symptoms worsen or fail to improve.    Carrol Aurora, NP

## 2023-11-08 DIAGNOSIS — F411 Generalized anxiety disorder: Secondary | ICD-10-CM | POA: Diagnosis not present

## 2023-11-08 DIAGNOSIS — F331 Major depressive disorder, recurrent, moderate: Secondary | ICD-10-CM | POA: Diagnosis not present

## 2023-11-08 DIAGNOSIS — F4310 Post-traumatic stress disorder, unspecified: Secondary | ICD-10-CM | POA: Diagnosis not present

## 2023-11-12 ENCOUNTER — Encounter: Payer: Self-pay | Admitting: General Practice

## 2023-11-12 ENCOUNTER — Ambulatory Visit: Payer: Self-pay | Admitting: General Practice

## 2023-11-12 ENCOUNTER — Ambulatory Visit (INDEPENDENT_AMBULATORY_CARE_PROVIDER_SITE_OTHER): Admitting: General Practice

## 2023-11-12 VITALS — BP 116/80 | HR 86 | Temp 97.6°F | Ht 60.5 in | Wt 113.0 lb

## 2023-11-12 DIAGNOSIS — B351 Tinea unguium: Secondary | ICD-10-CM | POA: Diagnosis not present

## 2023-11-12 DIAGNOSIS — F332 Major depressive disorder, recurrent severe without psychotic features: Secondary | ICD-10-CM

## 2023-11-12 DIAGNOSIS — F419 Anxiety disorder, unspecified: Secondary | ICD-10-CM | POA: Diagnosis not present

## 2023-11-12 DIAGNOSIS — F411 Generalized anxiety disorder: Secondary | ICD-10-CM | POA: Diagnosis not present

## 2023-11-12 DIAGNOSIS — F32A Depression, unspecified: Secondary | ICD-10-CM

## 2023-11-12 DIAGNOSIS — F331 Major depressive disorder, recurrent, moderate: Secondary | ICD-10-CM | POA: Diagnosis not present

## 2023-11-12 LAB — HEPATIC FUNCTION PANEL
ALT: 9 U/L (ref 0–35)
AST: 15 U/L (ref 0–37)
Albumin: 4.4 g/dL (ref 3.5–5.2)
Alkaline Phosphatase: 29 U/L — ABNORMAL LOW (ref 39–117)
Bilirubin, Direct: 0.1 mg/dL (ref 0.0–0.3)
Total Bilirubin: 0.4 mg/dL (ref 0.2–1.2)
Total Protein: 7.7 g/dL (ref 6.0–8.3)

## 2023-11-12 NOTE — Assessment & Plan Note (Addendum)
 Uncontrolled.  Denies SI/HI.   Patient would like a referral to a psychiatrist.  She will keep me posted regarding the FMLA paper work.

## 2023-11-12 NOTE — Progress Notes (Signed)
 Established Patient Office Visit  Subjective   Patient ID: Bianca Forbes, female    DOB: 1996-03-20  Age: 28 y.o. MRN: [id number]  Chief Complaint  Patient presents with   Anxiety    And depression follow up. Patient feels the same no changes.     HPI  Bianca Forbes is a 28 year old female with past medical history of MDD, anxiety, dysmenorrhea, IBS presents today for a follow up.   Onychomycosis: she was evaluated by Mallie Gaskins, NP on 10/24/23. It started in her 5th digit bilaterally however, it has spread to the great toes bilaterally. She was started on Terbinafine  250 mg daily without missing any doses. She reports that she is not seeing any improvement.   Anxiety and depression: she talked to her psychiatrist and they have told her that they will not fill her FMLA. However, she has talked to her psychologist who is willing to fill out the paperwork for her work on the days that she has panic attacks. She is also hoping to discuss with psychiatry to increase her Klonopin. She is hoping to find a new psychiatrist who can help manage her anxiety and depression.   Patient Active Problem List   Diagnosis Date Noted   Onychomycosis 04/05/2023   Chronic idiopathic urticaria 04/05/2023   Dysmenorrhea 04/05/2023   Acne 04/05/2023   Encounter to establish care with new doctor 04/05/2023   Major depressive disorder, recurrent, severe without psychotic features (HCC) 04/05/2023   Anxiety and depression 05/20/2019   Prediabetes 05/24/2015   Nexplanon in place 04/08/2014   Vitamin D  insufficiency 02/01/2013   Irritable bowel syndrome 01/06/2013   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   Tinnitus    Saw  ENT, assoc with emesis when occured   Past Surgical History:  Procedure Laterality Date   hemmorroid surgery  2024   No Known Allergies       11/12/2023   10:37 AM 11/07/2023   12:52 PM 10/24/2023   10:19 AM  Depression screen PHQ 2/9  Decreased Interest 1 2 2   Down, Depressed, Hopeless 1 3 2   PHQ - 2 Score 2 5 4   Altered sleeping 3 3 3   Tired, decreased energy 2 2 2   Change in appetite 3 3 3   Feeling bad or failure about yourself  1 1 0  Trouble concentrating 3 2 1   Moving slowly or fidgety/restless 1 0 1  Suicidal thoughts 0 0 0  PHQ-9 Score 15 16 14   Difficult doing work/chores Very difficult Very difficult Not difficult at all       11/12/2023   10:38 AM 11/07/2023   12:52 PM 05/28/2023    9:34 AM 04/05/2023   10:07 AM  GAD 7 : Generalized Anxiety Score  Nervous, Anxious, on Edge 3 2 2 2   Control/stop worrying 2 3 1 2   Worry too much - different things 3 2 0 2  Trouble relaxing 3 2 2 1   Restless 2 1 0 1  Easily annoyed or irritable 1 1 0 0  Afraid - awful might happen 2 3 2 2   Total GAD 7 Score 16 14 7 10   Anxiety Difficulty Very difficult Very difficult Very difficult Very difficult  Review of Systems  Constitutional:  Negative for chills and fever.  Respiratory:  Negative for shortness of breath.   Cardiovascular:  Negative for chest pain.  Gastrointestinal:  Negative for abdominal pain, constipation, diarrhea, heartburn, nausea and vomiting.  Genitourinary:  Negative for dysuria, frequency and urgency.  Neurological:  Negative for dizziness and headaches.  Endo/Heme/Allergies:  Negative for polydipsia.  Psychiatric/Behavioral:  Negative for depression and suicidal ideas. The patient is not nervous/anxious.       Objective:     BP 116/80   Pulse 86   Temp 97.6 F (36.4 C) (Oral)   Ht 5' 0.5 (1.537 m)   Wt 113 lb (51.3 kg)   LMP 10/21/2023   SpO2 98%   BMI 21.71 kg/m  BP Readings from Last 3 Encounters:  11/12/23 116/80  11/07/23 104/60   10/24/23 110/62   Wt Readings from Last 3 Encounters:  11/12/23 113 lb (51.3 kg)  11/07/23 114 lb (51.7 kg)  10/24/23 111 lb (50.3 kg)      Physical Exam Vitals and nursing note reviewed.  Constitutional:      Appearance: Normal appearance.  Cardiovascular:     Rate and Rhythm: Normal rate and regular rhythm.     Pulses: Normal pulses.     Heart sounds: Normal heart sounds.  Pulmonary:     Effort: Pulmonary effort is normal.     Breath sounds: Normal breath sounds.  Musculoskeletal:       Feet:  Feet:     Right foot:     Toenail Condition: Fungal disease present.    Left foot:     Toenail Condition: Fungal disease present. Neurological:     Mental Status: She is alert and oriented to person, place, and time.  Psychiatric:        Mood and Affect: Mood normal.        Behavior: Behavior normal.        Thought Content: Thought content normal.        Judgment: Judgment normal.      No results found for any visits on 11/12/23.     The ASCVD Risk score (Arnett DK, et al., 2019) failed to calculate for the following reasons:   The 2019 ASCVD risk score is only valid for ages 68 to 38    Assessment & Plan:  Onychomycosis Assessment & Plan: No improvement on exam today.  Hepatic panel pending.  Continue Terbinafine  250 mg once daily.  Referral placed for podiatry.   Orders: -     Ambulatory referral to Podiatry -     Hepatic function panel  Major depressive disorder, recurrent, severe without psychotic features (HCC) Assessment & Plan: Uncontrolled.  Denies SI/HI.   Patient would like a referral to a psychiatrist.  She will keep me posted regarding the FMLA paper work  Orders: -     Ambulatory referral to Psychiatry  Anxiety and depression Assessment & Plan: Uncontrolled.  Denies SI/HI.   Patient would like a referral to a psychiatrist.  She will keep me posted regarding the FMLA paper work.   Orders: -     Ambulatory referral to Psychiatry      Return in about 5 months (around 04/04/2024) for physical and labs.SABRA Carrol Aurora, NP

## 2023-11-12 NOTE — Patient Instructions (Addendum)
 Stop by the lab prior to leaving today. I will notify you of your results once received.   You will either be contacted via phone regarding your referral to podiatry and psychiatry, or you may receive a letter on your MyChart portal from our referral team with instructions for scheduling an appointment. Please let us  know if you have not been contacted by anyone within two weeks.  Schedule physical for January.   It was a pleasure to see you today!

## 2023-11-12 NOTE — Assessment & Plan Note (Signed)
 No improvement on exam today.  Hepatic panel pending.  Continue Terbinafine  250 mg once daily.  Referral placed for podiatry.

## 2023-11-14 ENCOUNTER — Ambulatory Visit: Admitting: General Practice

## 2023-11-14 DIAGNOSIS — F411 Generalized anxiety disorder: Secondary | ICD-10-CM | POA: Diagnosis not present

## 2023-11-14 DIAGNOSIS — F332 Major depressive disorder, recurrent severe without psychotic features: Secondary | ICD-10-CM | POA: Diagnosis not present

## 2023-11-15 ENCOUNTER — Ambulatory Visit (INDEPENDENT_AMBULATORY_CARE_PROVIDER_SITE_OTHER): Admitting: Podiatry

## 2023-11-15 ENCOUNTER — Encounter: Payer: Self-pay | Admitting: Podiatry

## 2023-11-15 DIAGNOSIS — B351 Tinea unguium: Secondary | ICD-10-CM | POA: Diagnosis not present

## 2023-11-15 MED ORDER — TAVABOROLE 5 % EX SOLN: 1.0000 | Freq: Every day | CUTANEOUS | 4 refills | Status: DC

## 2023-11-15 NOTE — Patient Instructions (Signed)
 Tavaborole  Topical solution What is this medication? TAVABOROLE  (ta va BO role) treats fungal infections of the nails. It belongs to a group of medications called antifungals. It will not treat infections caused by bacteria or viruses. This medicine may be used for other purposes; ask your health care provider or pharmacist if you have questions. COMMON BRAND NAME(S): KERYDIN  What should I tell my care team before I take this medication? They need to know if you have any of these conditions: An unusual or allergic reaction to tavaborole , other medications, foods, dyes, or preservatives Pregnant or trying to get pregnant Breast-feeding How should I use this medication? This medication is for external use only. Do not take by mouth. Wash your hands before and after use. If you are treating your hands, only wash your hands before use. Do not get it in your eyes. If you do, rinse your eyes with plenty of cool tap water. Use it as directed on the prescription label. Do not use it more often than directed. Use the medication for the full course as directed by your care team, even if you think you are better. Do not stop using it unless your care team tells you to stop it early. Apply a thin film of the medication to the affected area. Talk to your care team about the use of this medication in children. While it may be prescribed for children as young as 6 years for selected conditions, precautions do apply. Overdosage: If you think you have taken too much of this medicine contact a poison control center or emergency room at once. NOTE: This medicine is only for you. Do not share this medicine with others. What if I miss a dose? If you miss a dose, use it as soon as you can. If it is almost time for your next dose, use only that dose. Do not use double or extra doses. What may interact with this medication? Interactions have not been studied. Do not use any other nail products (i.e., nail polish,  pedicures) during treatment with this medication. This list may not describe all possible interactions. Give your health care provider a list of all the medicines, herbs, non-prescription drugs, or dietary supplements you use. Also tell them if you smoke, drink alcohol, or use illegal drugs. Some items may interact with your medicine. What should I watch for while using this medication? Visit your care team for regular checks on your progress. It may be some time before you see the benefit from this medication. After bathing, make sure your skin is very dry. Fungal infections like moist conditions. Do not walk around barefoot. To help prevent reinfection, wear freshly washed cotton, not synthetic, clothing. Tell your care team if you develop sores or blisters that do not heal properly. If your skin infection returns after you stop using this medication, contact your care team. What side effects may I notice from receiving this medication? Side effects that you should report to your care team as soon as possible: Allergic reactions--skin rash, itching, hives, swelling of the face, lips, tongue, or throat Burning, itching, crusting, or peeling of treated skin Side effects that usually do not require medical attention (report to your care team if they continue or are bothersome): Ingrown nails Mild skin irritation, redness, or dryness This list may not describe all possible side effects. Call your doctor for medical advice about side effects. You may report side effects to FDA at 1-800-FDA-1088. Where should I keep my medication? Keep out  of the reach of children and pets. Store at room temperature between 20 and 25 degrees C (68 and 77 degrees F). Keep this medication in the original container. Protect from moisture. Keep the container tightly closed. Avoid exposure to extreme heat. Get rid of any unused medication 3 months after opening. This medication is flammable. Avoid exposure to heat, fire,  flame, and smoking. To get rid of medications that are no longer needed or have expired: Take the medications to a medication take-back program. Check with your pharmacy or law enforcement to find a location. If you cannot return the medication, check the label or package insert to see if the medication should be thrown out in the garbage or flushed down the toilet. If you are not sure, ask your care team. If it is safe to put it in the trash, empty the medication out of the container. Mix the medication with cat litter, dirt, coffee grounds, or other unwanted substance. Seal the mixture in a bag or container. Put it in the trash. NOTE: This sheet is a summary. It may not cover all possible information. If you have questions about this medicine, talk to your doctor, pharmacist, or health care provider.  2024 Elsevier/Gold Standard (2021-07-06 00:00:00)

## 2023-11-15 NOTE — Progress Notes (Unsigned)
 Subjective:   Patient ID: Bianca Forbes, female   DOB: 28 y.o.   MRN: [id number]   HPI Chief Complaint  Patient presents with   Nail Problem    Onychomycosis. Tried, Jublia  and Kerydin , [place] . Now taking lamisil  going on second month. Pt. Had insurance issues with Kerydin .    28 year old female presents the office with above concerns.  She said that she is on her second month of Lamisil  without any side effects.  She was also previously on Kerydin  which did help however she was not able to get this approved at her pharmacy.  She was also on Jublia  with a curette and seemed to do better.   Review of Systems  All other systems reviewed and are negative.  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   Tinnitus    Saw ENT, assoc with emesis when occured    Past Surgical History:  Procedure Laterality Date   hemmorroid surgery  2024     Current Outpatient Medications:    baclofen  (LIORESAL ) 10 MG tablet, Take 0.5 tablets (5 mg total) by mouth daily as needed for muscle spasms., Disp: 10 each, Rfl: 0   buPROPion (WELLBUTRIN XL) 150 MG 24 hr tablet, Take 150 mg by mouth every morning., Disp: , Rfl:    buPROPion (WELLBUTRIN XL) 300 MG 24 hr tablet, Take 300 mg by mouth daily., Disp: , Rfl: 1   busPIRone (BUSPAR) 15 MG tablet, Take 15 mg by mouth 3 (three) times daily., Disp: , Rfl:    ciclopirox  (LOPROX ) 0.77 % cream, Apply topically 2 (two) times daily., Disp: 30 g, Rfl: 0   clonazePAM (KLONOPIN) 0.5 MG tablet, TAKE 1 2 TO 1 (ONE HALF TO ONE) TABLET BY MOUTH TWICE DAILY AS NEEDED FOR ANXIETY AND PANIC ATTACK, Disp: , Rfl:    dicyclomine  (BENTYL ) 20 MG tablet, Take 1 tablet (20 mg total) by mouth 3 (three) times  daily before meals., Disp: 90 tablet, Rfl: 1   Efinaconazole  10 % SOLN, Apply 1 Application topically daily., Disp: 8 mL, Rfl: 0   fexofenadine  (ALLEGRA ) 180 MG tablet, Take 1 tablet (180 mg total) by mouth daily as needed for allergies or rhinitis., Disp: 90 tablet, Rfl: 0   naproxen  (NAPROSYN ) 250 MG tablet, Take 1 tablet (250 mg total) by mouth 2 (two) times daily as needed. Menstrual cramps., Disp: 60 tablet, Rfl: 0   spironolactone  (ALDACTONE ) 100 MG tablet, TAKE 1 TABLET BY MOUTH EVERYDAY AT BEDTIME, Disp: 30 tablet, Rfl: 2   spironolactone  (ALDACTONE ) 25 MG tablet, TAKE 1 TABLET BY MOUTH EVERY DAY IN THE MORNING, Disp: 30 tablet, Rfl: 2   Tavaborole  5 % SOLN, Apply 1 Application topically daily., Disp: 10 mL, Rfl: 4   terbinafine  (LAMISIL ) 250 MG tablet, Take 1 tablet (250 mg total) by mouth daily. For toenail fungus, Disp: 30 tablet, Rfl: 0  No Known Allergies         Objective:  Physical Exam  General: AAO x3, NAD  Dermatological: Nails are mildly hypertrophic, dystrophic with yellow, brown discoloration.  There is no edema, erythema or any signs of infection today.  There are no open lesions.  No pain in the nails.  Vascular: Dorsalis Pedis artery and Posterior Tibial artery  pedal pulses are 2/4 bilateral with immedate capillary fill time.  There is no pain with calf compression, swelling, warmth, erythema.   Neruologic: Grossly intact via light touch bilateral.  Musculoskeletal: No areas of discomfort today.  Gait: Unassisted, Nonantalgic.       Assessment:   Onychomycosis     Plan:  -Treatment options discussed including all alternatives, risks, and complications -Etiology of symptoms were discussed - Recommend he continue the course of Lamisil  and continue to monitor for any side effects.  Prescribed Kerydin  today for [facility]. -We discussed measures to help prevent reoccurrence externally.  She modifications, change in shoes and socks regularly and  avoid going barefoot below places as well as changing socks regularly.    Bianca Forbes DPM

## 2023-11-21 DIAGNOSIS — F331 Major depressive disorder, recurrent, moderate: Secondary | ICD-10-CM | POA: Diagnosis not present

## 2023-11-21 DIAGNOSIS — F411 Generalized anxiety disorder: Secondary | ICD-10-CM | POA: Diagnosis not present

## 2023-11-25 ENCOUNTER — Other Ambulatory Visit: Payer: Self-pay | Admitting: General Practice

## 2023-11-25 DIAGNOSIS — K580 Irritable bowel syndrome with diarrhea: Secondary | ICD-10-CM

## 2023-11-29 DIAGNOSIS — F411 Generalized anxiety disorder: Secondary | ICD-10-CM | POA: Diagnosis not present

## 2023-11-29 DIAGNOSIS — F331 Major depressive disorder, recurrent, moderate: Secondary | ICD-10-CM | POA: Diagnosis not present

## 2023-12-03 DIAGNOSIS — F411 Generalized anxiety disorder: Secondary | ICD-10-CM | POA: Diagnosis not present

## 2023-12-03 DIAGNOSIS — F331 Major depressive disorder, recurrent, moderate: Secondary | ICD-10-CM | POA: Diagnosis not present

## 2023-12-04 ENCOUNTER — Other Ambulatory Visit: Payer: Self-pay | Admitting: Primary Care

## 2023-12-04 DIAGNOSIS — B351 Tinea unguium: Secondary | ICD-10-CM

## 2023-12-10 DIAGNOSIS — F331 Major depressive disorder, recurrent, moderate: Secondary | ICD-10-CM | POA: Diagnosis not present

## 2023-12-10 DIAGNOSIS — F411 Generalized anxiety disorder: Secondary | ICD-10-CM | POA: Diagnosis not present

## 2023-12-16 DIAGNOSIS — F331 Major depressive disorder, recurrent, moderate: Secondary | ICD-10-CM | POA: Diagnosis not present

## 2023-12-16 DIAGNOSIS — F411 Generalized anxiety disorder: Secondary | ICD-10-CM | POA: Diagnosis not present

## 2023-12-17 ENCOUNTER — Encounter: Payer: Self-pay | Admitting: Physician Assistant

## 2023-12-17 ENCOUNTER — Ambulatory Visit (INDEPENDENT_AMBULATORY_CARE_PROVIDER_SITE_OTHER): Admitting: Physician Assistant

## 2023-12-17 VITALS — BP 113/60 | HR 87

## 2023-12-17 DIAGNOSIS — L818 Other specified disorders of pigmentation: Secondary | ICD-10-CM

## 2023-12-17 DIAGNOSIS — B360 Pityriasis versicolor: Secondary | ICD-10-CM

## 2023-12-17 DIAGNOSIS — L700 Acne vulgaris: Secondary | ICD-10-CM | POA: Diagnosis not present

## 2023-12-17 DIAGNOSIS — Q825 Congenital non-neoplastic nevus: Secondary | ICD-10-CM

## 2023-12-17 MED ORDER — SPIRONOLACTONE 25 MG PO TABS: 25.0000 mg | ORAL_TABLET | Freq: Every day | ORAL | 11 refills | Status: AC

## 2023-12-17 MED ORDER — KETOCONAZOLE 2 % EX SHAM: 1.0000 | MEDICATED_SHAMPOO | Freq: Once | CUTANEOUS | 6 refills | Status: DC

## 2023-12-17 MED ORDER — SPIRONOLACTONE 100 MG PO TABS: 100.0000 mg | ORAL_TABLET | Freq: Every day | ORAL | 11 refills | Status: AC

## 2023-12-17 MED ORDER — KETOCONAZOLE 2 % EX SHAM: MEDICATED_SHAMPOO | CUTANEOUS | 6 refills | Status: DC

## 2023-12-17 MED ORDER — TRETINOIN 0.025 % EX CREA: TOPICAL_CREAM | CUTANEOUS | 6 refills | Status: DC

## 2023-12-17 MED ORDER — KETOCONAZOLE 2 % EX SHAM: 1.0000 | MEDICATED_SHAMPOO | Freq: Once | CUTANEOUS | 0 refills | Status: DC

## 2023-12-17 MED ORDER — TRETINOIN 0.025 % EX CREA: TOPICAL_CREAM | Freq: Every day | CUTANEOUS | 6 refills | Status: DC

## 2023-12-17 NOTE — Addendum Note (Signed)
 Addended by: ORMAN AMERICA on: 12/17/2023 10:02 AM   Modules accepted: Orders

## 2023-12-17 NOTE — Patient Instructions (Signed)

## 2023-12-17 NOTE — Progress Notes (Signed)
   New Patient Visit   Subjective  Bianca Forbes is a 28 y.o. female NEW PATIENT who presents for the following: Acne  Patient states she has acne located at the face and neck that she would like to have examined. Patient reports the areas have been there for 10 years. She reports the areas are not bothersome. She states that the areas have not spread. Patient reports she has previously been treated for these areas. Patient reports that she seen a dermatologist in [place]  that prescribed her spironolactone  100 mg and 25 mg.  Patient denies Hx of bx. Patient denies family history of skin cancer(s).  Other concerns: dark spots on lips, birthmark on back of neck and rash that comes and goes on her trunk that her father has, as well.     The following portions of the chart were reviewed this encounter and updated as appropriate: medications, allergies, medical history  Review of Systems:  No other skin or systemic complaints except as noted in HPI or Assessment and Plan.  Objective  Well appearing patient in no apparent distress; mood and affect are within normal limits.   A focused examination was performed of the following areas: Face and neck  Relevant exam findings are noted in the Assessment and Plan.             Assessment & Plan   CONGENITAL NEVUS  - benign appearing  - call for changes   LABIAL MELANOCYTIC MACULES  - benign appearing  - recommend SPF lip protection   ACNE VULGARIS  - well controlled with oral spironolactone  - add tretinoin  nightly as directed   TINEA VERSICOLOR   Use ketoconazole  shampoo as body wash once a week, especially in summer months   Tinea versicolor is a chronic recurrent skin rash causing discolored scaly spots most commonly seen on back, chest, and/or shoulders.  It is generally asymptomatic. The rash is due to overgrowth of a common type of yeast present on everyone's skin and it is not contagious.  It tends to flare more in the  summer due to increased sweating on trunk.  After rash is treated, the scaliness will resolve, but the discoloration will take longer to return to normal pigmentation. The periodic use of an OTC medicated soap/shampoo with zinc or selenium sulfide can be helpful to prevent yeast overgrowth and recurrence.    ACNE VULGARIS   Related Medications spironolactone  (ALDACTONE ) 100 MG tablet Take 1 tablet (100 mg total) by mouth daily. spironolactone  (ALDACTONE ) 25 MG tablet Take 1 tablet (25 mg total) by mouth daily. tretinoin  (RETIN-A ) 0.025 % cream Apply to affected areas every other night building up to nightly. TINEA VERSICOLOR   Related Medications ketoconazole  (NIZORAL ) 2 % shampoo APPLY to affected areas (trunk) daily until no rash seen then use once weekly for prevention LABIAL MELANOTIC MACULE   CONGENITAL NON-NEOPLASTIC NEVUS    Return in about 6 months (around 06/15/2024) for Acne follow up.  I, Doyce Pan, CMA, am acting as scribe for Sharlee Rufino K, PA-C.   Documentation: I have reviewed the above documentation for accuracy and completeness, and I agree with the above.  Terin Cragle K, PA-C

## 2023-12-26 DIAGNOSIS — F331 Major depressive disorder, recurrent, moderate: Secondary | ICD-10-CM | POA: Diagnosis not present

## 2023-12-26 DIAGNOSIS — F411 Generalized anxiety disorder: Secondary | ICD-10-CM | POA: Diagnosis not present

## 2024-01-02 DIAGNOSIS — F331 Major depressive disorder, recurrent, moderate: Secondary | ICD-10-CM | POA: Diagnosis not present

## 2024-01-02 DIAGNOSIS — F4312 Post-traumatic stress disorder, chronic: Secondary | ICD-10-CM | POA: Diagnosis not present

## 2024-01-02 DIAGNOSIS — F411 Generalized anxiety disorder: Secondary | ICD-10-CM | POA: Diagnosis not present
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
## 2024-01-09 DIAGNOSIS — F411 Generalized anxiety disorder: Secondary | ICD-10-CM | POA: Diagnosis not present

## 2024-01-09 DIAGNOSIS — F331 Major depressive disorder, recurrent, moderate: Secondary | ICD-10-CM | POA: Diagnosis not present

## 2024-01-13 ENCOUNTER — Ambulatory Visit (HOSPITAL_BASED_OUTPATIENT_CLINIC_OR_DEPARTMENT_OTHER)

## 2024-01-13 VITALS — BP 113/82 | HR 90 | Ht 60.0 in | Wt 113.0 lb

## 2024-01-13 DIAGNOSIS — F411 Generalized anxiety disorder: Secondary | ICD-10-CM

## 2024-01-13 DIAGNOSIS — F332 Major depressive disorder, recurrent severe without psychotic features: Secondary | ICD-10-CM

## 2024-01-13 MED ORDER — BUSPIRONE HCL 30 MG PO TABS: 30.0000 mg | ORAL_TABLET | Freq: Two times a day (BID) | ORAL | 2 refills | Status: DC

## 2024-01-14 NOTE — Addendum Note (Signed)
 Addended by: CARVIN CROCK on: 01/14/2024 07:59 AM   Modules accepted: Level of Service

## 2024-01-15 DIAGNOSIS — F331 Major depressive disorder, recurrent, moderate: Secondary | ICD-10-CM | POA: Diagnosis not present

## 2024-01-15 DIAGNOSIS — F411 Generalized anxiety disorder: Secondary | ICD-10-CM | POA: Diagnosis not present

## 2024-01-22 DIAGNOSIS — F331 Major depressive disorder, recurrent, moderate: Secondary | ICD-10-CM | POA: Diagnosis not present

## 2024-01-22 DIAGNOSIS — F411 Generalized anxiety disorder: Secondary | ICD-10-CM | POA: Diagnosis not present

## 2024-01-29 DIAGNOSIS — F331 Major depressive disorder, recurrent, moderate: Secondary | ICD-10-CM | POA: Diagnosis not present

## 2024-01-29 DIAGNOSIS — F411 Generalized anxiety disorder: Secondary | ICD-10-CM | POA: Diagnosis not present

## 2024-01-29 NOTE — Progress Notes (Addendum)
 Outpatient Psychiatric Follow Up  Patient Identification: Bianca Forbes MRN:  [id number] Date of Evaluation:  01/29/2024 Referral Source: PCP Chief Complaint:   No chief complaint on file.  Visit Diagnosis:    ICD-10-CM   1. GAD (generalized anxiety disorder)  F41.1 busPIRone (BUSPAR) 30 MG tablet    2. MDD (recurrent major depressive disorder) in remission  F33.40 buPROPion (WELLBUTRIN XL) 300 MG 24 hr tablet    buPROPion (WELLBUTRIN XL) 150 MG 24 hr tablet    busPIRone (BUSPAR) 30 MG tablet     Assessment:  Bianca Forbes is a 28 y.o. female with a history of MDD, GAD who presents in person to [facility] at [facility] for initial evaluation on 01/29/2024.    Patient has improved from initial visit, depression and anxiety symptoms have been better.  She has had 2 panic attacks in the past month likely related to being stressed about her physical concerns.  She has not been actively or passively homicidal or suicidal, has been eating and sleeping well.  She has needed Klonopin 2 times in the past 1 month, is very motivated to taper down further.  She has had some decreased energy, decreased interest in daily activities however she has been relating that to changes in the weather, has had similar symptoms every year during these months.  Apart from having blocking sensation in her left neck she has not reported any other physical concerns or any recent changes in the medications.  Suspicion for personality diathesis playing a role in her presentation continues.  She has been followed up by a therapist, they are starting DBT in the upcoming weeks.  No changes in the medicines in the current visit, encouraged her to divide the 30 mg buspirone into half to take in the afternoon and at night, patient agreeable to the plan.  She works at [employer] and requests to be seen by non-resident provider which we are able to accommodate. RTC in approx. 1 month with next  provider.  Risk Assessment: A suicide and violence risk assessment was performed as part of this evaluation. There patient is deemed to be at chronic elevated risk for self-harm/suicide given the following factors: history of depression. These risk factors are mitigated by the following factors: lack of active SI/HI, no known access to weapons or firearms, no history of previous suicide attempts, no history of violence, and motivation for treatment. The patient is deemed to be at chronic elevated risk for violence given the following factors: N/A. These risk factors are mitigated by the following factors: no known history of violence towards others, no known violence towards others in the last 6 months, no known history of threats of harm towards others, no known homicidal ideation in the last 6 months, no command hallucinations to harm others in the last 6 months, no active symptoms of psychosis, and no active symptoms of mania. There is no acute risk for suicide or violence at this time. The patient was educated about relevant modifiable risk factors including following recommendations for treatment of psychiatric illness and abstaining from substance abuse.  While future psychiatric events cannot be accurately predicted, the patient does not currently require  acute inpatient psychiatric care and does not currently meet [place]  involuntary commitment criteria.  Patient was given contact information for crisis resources, behavioral health clinic and was instructed to call 911 for emergencies.    Plan: # MDD without psychotic features Past medication trials: Wellbutrin Status of problem: New to the  writer Interventions: -- Continue bupropion 300 mg daily -- Continue bupropion 150 mg as needed for severe depression  # GAD Past medication trials: Klonopin, buspirone Status of problem: New to the writer Interventions: -- Continue Klonopin 0.5 mg as needed for anxiety, reported she has enough  prescription, did not prescribe -- Continue buspirone to 30 mg twice daily for increased anxiety/panic attacks, can be taken as 30 mg in the morning, 15 mg in the afternoon, 15 mg at night  # BPD? Past medication trials: None Status of problem: Current Interventions: -- Patient has a psychologist, was brought up about having borderline personality in her appointment, and switching to DBT with them.   History of Present Illness:    Bianca Forbes is a 28 year old female with a history of depression and anxiety, PTSD, panic attacks and suicide attempt at the age of 24 by overdosing that presented to the clinic for follow up.  Today, she reported her mood as good .  She denied any active or passive SI/HI/AVH, reported good sleep and good appetite.  Reported that she had developed left-sided neck tightness, feeling like a lump in the throat and stuffiness/congestion in the nose yesterday which she was worried about and had to take a benzodiazepine to relieve the symptoms.  Reported that this was the only episode, inquired about if it could be anxiety, patient stated that symptoms of palpitations, numbness and tingling sensation went away after taking the benzodiazepine.   Reported having 2 panic attacks over the past 1 month it was mostly when I was not sure what was happening with my body .  Reported that she is going to her primary care provider today to have it taken care of.  She reported good appetite, good sleep.  Reported that she had to take 450 mg of bupropion for a week and a half and developed tremors after that which resolved after reducing the dose back to 300 mg.  When asked about why she had to take she stated I had no energy, did not want to get out of the bed , but denied having any such concerns recently.  Reported that she she has been followed by her therapist and they are working on emotional regulation, social anxiety and compulsive behaviors.  Stated that she has behaviors like  I have to check lock doors twice, have to check my gas multiple times .  When asked about depression she stated it is fine.  Reported that she is not using any substances including alcohol or cigarettes.  She inquired about social anxiety, and the medications needed including propranolol, she did not want to be on any as needed medications.  We discussed about taking 30 mg of buspirone in the morning and 15 mg in the afternoon and 15 mg in the evening, patient amenable to the plan.  She was concerned about being followed by a resident in the clinic, requested follow-up with an attending.  She did not want a follow-up appointment scheduled.   Associated Signs/Symptoms: Depression Symptoms:  depressed mood, fatigue, difficulty concentrating, recurrent thoughts of death, anxiety, loss of energy/fatigue, (Hypo) Manic Symptoms:  None Anxiety Symptoms:  Excessive Worry, Panic Symptoms, Psychotic Symptoms:  None PTSD Symptoms: Negative  Past Psychiatric History:  Past psychiatric diagnoses: Major depressive disorder, anxiety Psychiatric hospitalizations: None Past suicide attempts: Denies Hx of self harm: Denies Hx of violence towards others: Denies Prior psychiatric providers: Mojeed, Akintayo, Thomas, Alayna Prior therapy: None Access to firearms: Denies  Prior medication trials:  Klonopin, Wellbutrin, Buspirone  Substance use: Denies  Past Medical History:  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   Tinnitus    Saw ENT, assoc with emesis when occured    Past Surgical History:  Procedure Laterality Date   hemmorroid surgery  2024    Family Psychiatric History: Mother has depression that was treated  with Cymbalta  and reported that sister psychosis.  Family History:  Family History  Problem Relation Age of Onset   Diabetes Mother        Type 2   Irritable bowel syndrome Father    Lactose intolerance Father    Vitiligo Father    Alzheimer's disease Maternal Grandmother        Died at 29   Seizures Maternal Grandmother    Kidney disease Maternal Grandfather        Died at 40   Other Paternal Grandfather        Died at 100 from natural causes   Seizures Cousin        Maternal 1st Cousin    Social History:   Social History   Socioeconomic History   Marital status: Single    Spouse name: Not on file   Number of children: 0   Years of education: Not on file   Highest education level: Bachelor's degree (e.g., BA, AB, BS)  Occupational History   Not on file  Tobacco Use   Smoking status: Never    Passive exposure: Never   Smokeless tobacco: Never  Vaping Use   Vaping status: Never Used  Substance and Sexual Activity   Alcohol use: No    Alcohol/week: 0.0 standard drinks of alcohol   Drug use: No   Sexual activity: Never  Other Topics Concern   Not on file  Social History Narrative   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    She lives with both parents.   Right handed      Social Drivers of Health   Financial Resource Strain: Low Risk  (06/23/2020)   Received from [facility]   Overall Financial Resource Strain (CARDIA)    Difficulty of Paying Living Expenses: Not very hard  Food Insecurity: No Food Insecurity (06/23/2020)   Received from [facility]   Hunger Vital Sign    Within the past 12 months, you worried that your food would run out before you got the money to buy more.: Never true    Within the past 12 months, the food you bought just didn't last and you didn't have money to get more.: Never true  Transportation Needs:  No Transportation Needs (06/23/2020)   Received from [facility]   PRAPARE - Transportation    Lack of Transportation (Medical): No    Lack of Transportation (Non-Medical): No  Physical Activity: Inactive (06/23/2020)   Received from [facility]   Exercise Vital Sign    On average, how many days per week do you engage in moderate to strenuous exercise (like a brisk walk)?: 0 days    On average, how many minutes do you  engage in exercise at this level?: 0 min  Stress: Stress Concern Present (06/23/2020)   Received from [facility]   [employer] of Occupational Health - Occupational Stress Questionnaire    Feeling of Stress : Rather much  Social Connections: Unknown (06/23/2020)   Received from [facility]   Social Connection and Isolation Panel    In a typical week, how many times do you talk on the phone with family, friends, or neighbors?: Patient declined    How often do you get together with friends or relatives?: Patient declined    How often do you attend church or religious services?: Patient declined    Do you belong to any clubs or organizations such as church groups, unions, fraternal or athletic groups, or school groups?: Patient declined    How often do you attend meetings of the clubs or organizations you belong to?: Patient declined    Are you married, widowed, divorced, separated, never married, or living with a partner?: Patient declined    Additional Social History: Patient works as a equities trader at [employer]  Allergies:  No Known Allergies  Metabolic Disorder Labs: Lab Results  Component Value Date   HGBA1C 5.7 05/28/2023   MPG 108 06/06/2016   MPG 103 01/10/2016   No results found for: PROLACTIN Lab Results  Component Value Date   CHOL 114 (L) 05/24/2015   TRIG 69 05/21/2014   HDL 50 05/24/2015   CHOLHDL 2.6 05/21/2014   VLDL 14 05/21/2014   LDLCALC 52 05/21/2014   Lab Results  Component  Value Date   TSH 1.39 05/28/2023    Therapeutic Level Labs: No results found for: LITHIUM No results found for: CBMZ No results found for: VALPROATE  Current Medications: Current Outpatient Medications  Medication Sig Dispense Refill   baclofen  (LIORESAL ) 10 MG tablet Take 0.5 tablets (5 mg total) by mouth daily as needed for muscle spasms. 10 each 0   buPROPion (WELLBUTRIN XL) 150 MG 24 hr tablet Take 150 mg by mouth every morning.     buPROPion (WELLBUTRIN XL) 300 MG 24 hr tablet Take 300 mg by mouth daily.  1   busPIRone (BUSPAR) 30 MG tablet Take 1 tablet (30 mg total) by mouth 2 (two) times daily. 60 tablet 2   clonazePAM (KLONOPIN) 0.5 MG tablet TAKE 1 2 TO 1 (ONE HALF TO ONE) TABLET BY MOUTH TWICE DAILY AS NEEDED FOR ANXIETY AND PANIC ATTACK     dicyclomine  (BENTYL ) 20 MG tablet Take 1 tablet (20 mg total) by mouth 3 (three) times daily before meals. 90 tablet 1   Efinaconazole  10 % SOLN Apply 1 Application topically daily. 8 mL 0   fexofenadine  (ALLEGRA ) 180 MG tablet Take 1 tablet (180 mg total) by mouth daily as needed for allergies or rhinitis. 90 tablet 0   ketoconazole  (NIZORAL ) 2 % shampoo APPLY to affected areas (trunk) daily until no rash seen then use once weekly for prevention 120 mL 6   naproxen  (NAPROSYN ) 250 MG tablet Take 1 tablet (250 mg total) by mouth 2 (two) times daily as needed. Menstrual cramps. 60 tablet 0   spironolactone  (ALDACTONE ) 100 MG tablet Take 1 tablet (100 mg total) by mouth daily. 30 tablet 11   spironolactone  (ALDACTONE ) 25 MG tablet Take 1 tablet (25 mg total) by mouth daily. 30 tablet 11   Tavaborole  5 % SOLN Apply 1 Application topically daily. 10 mL 4   tretinoin  (RETIN-A ) 0.025 % cream Apply to  affected areas every other night building up to nightly. 45 g 6   No current facility-administered medications for this visit.    Musculoskeletal: Strength & Muscle Tone: within normal limits Gait & Station: normal Patient leans:  N/A  Psychiatric Specialty Exam:  Psychiatric Specialty Exam: There were no vitals taken for this visit.There is no height or weight on file to calculate BMI. Review of Systems  Constitutional:  Negative for activity change, appetite change, chills, diaphoresis and fatigue.  HENT:  Negative for congestion, dental problem, drooling, ear discharge and ear pain.   Eyes:  Negative for pain, discharge and itching.  Respiratory:  Negative for apnea, cough, choking and chest tightness.   Cardiovascular:  Negative for chest pain, palpitations and leg swelling.  Gastrointestinal:  Negative for abdominal distention, abdominal pain, constipation, diarrhea and nausea.  Endocrine: Negative for cold intolerance and heat intolerance.  Genitourinary:  Negative for difficulty urinating, dysuria, flank pain, frequency, hematuria and urgency.  Musculoskeletal:  Negative for arthralgias, back pain, gait problem, joint swelling, myalgias and neck pain.  Skin:  Negative for color change and pallor.  Allergic/Immunologic: Negative for environmental allergies and food allergies.  Neurological:  Negative for dizziness, seizures, syncope, facial asymmetry, speech difficulty, light-headedness, numbness and headaches.  Psychiatric/Behavioral:  Negative for agitation, behavioral problems, confusion, decreased concentration, dysphoric mood, hallucinations, self-injury, sleep disturbance and suicidal ideas. The patient is not nervous/anxious and is not hyperactive.     General Appearance: Casual and Fairly Groomed  Eye Contact:  Good  Speech:  Clear and Coherent and Normal Rate  Volume:  Normal  Mood:  Euthymic  Affect:  Appropriate and Congruent  Thought Content: Logical   Suicidal Thoughts:  No  Homicidal Thoughts:  No  Thought Process:  Coherent and Linear  Orientation:  Full (Time, Place, and Person)    Memory: Immediate;   Good Recent;   Good Remote;   Good  Judgment:  Fair  Insight:  Fair  Concentration:   Concentration: Good and Attention Span: Good  Recall:  not formally assessed   Fund of Knowledge: Good  Language: Good  Psychomotor Activity:  Normal  Akathisia:  No  AIMS (if indicated): not done  Assets:  Communication Skills Desire for Improvement Financial Resources/Insurance Housing Leisure Time Resilience Transportation  ADL's:  Intact  Cognition: WNL  Sleep:  Fair    Screenings: GAD-7    Flowsheet Row [facility] Office Visit from 11/12/2023 in [facility] Office Visit from 11/07/2023 in [facility] Office Visit from 05/28/2023 in [facility] Office Visit from 04/05/2023 in [facility]  Total GAD-7 Score 15 16 14 7 10    PHQ2-9    Flowsheet Row Office Visit from 01/13/2024 in [facility] Office Visit from 11/12/2023 in [facility] Office Visit from 11/07/2023 in [facility] Office Visit from 10/24/2023 in [facility] Office Visit from 05/28/2023 in [facility] [place]  PHQ-2 Total Score 5 2 5 4 6   PHQ-9 Total Score 14 15 16 14 15    Flowsheet Row Office Visit from 01/13/2024 in [facility]  C-SSRS RISK CATEGORY Error: Q3, 4, or 5 should not be populated when Q2 is No     Collaboration of  Care: Other PCP notes, Dr. Carvin  Patient/Guardian was advised Release of Information must be obtained prior to any record release in order to collaborate their care with an outside provider. Patient/Guardian was advised if they have not already done so to contact the registration department to sign all necessary forms in order for us  to release information regarding their care.    Consent: Patient/Guardian gives verbal consent for treatment and assignment of benefits for services provided during this visit. Patient/Guardian expressed understanding and agreed to proceed.   Tyrena Gohr, MD 10/29/20259:20 AM

## 2024-02-05 ENCOUNTER — Encounter: Payer: Self-pay | Admitting: Internal Medicine

## 2024-02-05 ENCOUNTER — Ambulatory Visit (HOSPITAL_BASED_OUTPATIENT_CLINIC_OR_DEPARTMENT_OTHER)

## 2024-02-05 ENCOUNTER — Ambulatory Visit: Payer: Self-pay

## 2024-02-05 ENCOUNTER — Encounter (HOSPITAL_COMMUNITY): Payer: Self-pay

## 2024-02-05 ENCOUNTER — Ambulatory Visit: Admitting: Internal Medicine

## 2024-02-05 VITALS — BP 94/68 | HR 87 | Ht 60.0 in | Wt 114.4 lb

## 2024-02-05 VITALS — BP 109/76 | HR 97 | Ht 60.0 in | Wt 113.0 lb

## 2024-02-05 DIAGNOSIS — F411 Generalized anxiety disorder: Secondary | ICD-10-CM | POA: Diagnosis not present

## 2024-02-05 DIAGNOSIS — J029 Acute pharyngitis, unspecified: Secondary | ICD-10-CM | POA: Diagnosis not present

## 2024-02-05 DIAGNOSIS — R09A2 Foreign body sensation, throat: Secondary | ICD-10-CM | POA: Diagnosis not present

## 2024-02-05 DIAGNOSIS — F3340 Major depressive disorder, recurrent, in remission, unspecified: Secondary | ICD-10-CM | POA: Diagnosis not present

## 2024-02-05 DIAGNOSIS — R002 Palpitations: Secondary | ICD-10-CM | POA: Diagnosis not present

## 2024-02-05 DIAGNOSIS — F331 Major depressive disorder, recurrent, moderate: Secondary | ICD-10-CM | POA: Diagnosis not present

## 2024-02-05 LAB — CBC WITH DIFFERENTIAL/PLATELET
Basophils Absolute: 0 K/uL (ref 0.0–0.1)
Basophils Relative: 0.6 % (ref 0.0–3.0)
Eosinophils Absolute: 0.1 K/uL (ref 0.0–0.7)
Eosinophils Relative: 1.7 % (ref 0.0–5.0)
HCT: 41.8 % (ref 36.0–46.0)
Hemoglobin: 13.9 g/dL (ref 12.0–15.0)
Lymphocytes Relative: 38.7 % (ref 12.0–46.0)
Lymphs Abs: 2.3 K/uL (ref 0.7–4.0)
MCHC: 33.2 g/dL (ref 30.0–36.0)
MCV: 91.6 fl (ref 78.0–100.0)
Monocytes Absolute: 0.4 K/uL (ref 0.1–1.0)
Monocytes Relative: 7.3 % (ref 3.0–12.0)
Neutro Abs: 3 K/uL (ref 1.4–7.7)
Neutrophils Relative %: 51.7 % (ref 43.0–77.0)
Platelets: 231 K/uL (ref 150.0–400.0)
RBC: 4.56 Mil/uL (ref 3.87–5.11)
RDW: 13.8 % (ref 11.5–15.5)
WBC: 5.9 K/uL (ref 4.0–10.5)

## 2024-02-05 LAB — TSH: TSH: 1.93 u[IU]/mL (ref 0.35–5.50)

## 2024-02-05 LAB — POC COVID19 BINAXNOW: SARS Coronavirus 2 Ag: NEGATIVE

## 2024-02-05 LAB — POCT INFLUENZA A/B
Influenza A, POC: NEGATIVE
Influenza B, POC: NEGATIVE

## 2024-02-05 LAB — POCT RAPID STREP A (OFFICE): Rapid Strep A Screen: NEGATIVE

## 2024-02-05 MED ORDER — BUPROPION HCL ER (XL) 300 MG PO TB24: 300.0000 mg | ORAL_TABLET | Freq: Every day | ORAL | 2 refills | Status: DC

## 2024-02-05 MED ORDER — BUSPIRONE HCL 30 MG PO TABS: 30.0000 mg | ORAL_TABLET | Freq: Two times a day (BID) | ORAL | 2 refills | Status: DC

## 2024-02-05 MED ORDER — BUPROPION HCL ER (XL) 150 MG PO TB24: 150.0000 mg | ORAL_TABLET | Freq: Every morning | ORAL | 2 refills | Status: AC

## 2024-02-05 NOTE — Telephone Encounter (Signed)
 Appt scheduled

## 2024-02-05 NOTE — Patient Instructions (Signed)
 Your symptoms may be due to an early viral infection;  but you have no signs of flu,  COVID or strep throat based on the tests we did today   If you develop fever, body aches, or other signs of an upper respiratory infection,  treat with tylenol/motrin , and afrin nasal spray (if needed ,  for 3 days only)  for congestion  If you DONT develop more coldsymptoms and your feeling of a lump persists  until Monday,  call the office or Mychart me and I will order an ultrasound of your neck to evaluate your thyroid 

## 2024-02-05 NOTE — Progress Notes (Unsigned)
 Subjective:  Patient ID: Bianca Forbes, female    DOB: 03/08/1996  Age: 28 y.o. MRN: [id number]  CC: The primary encounter diagnosis was Sore throat. Diagnoses of Palpitations and Sensation of lump in throat were also pertinent to this visit.   HPI Bianca Forbes presents for  Chief Complaint  Patient presents with   Sore Throat    Started two days ago with left side sore throat and left side nasal congestion   Started yesterday in the morning as a painless  lump in throat,  not painful to swallow.  No dysphagia .  Accompanied by palpitations yesterday that have happened in the past ,  lasted a minute or two.  Has not recurred.  Lump has persisted,  took a 1/2 clonazepam, benadryl and nasal spray for sudden onset of sinus congestion that started last night.  No cough,  fevers or body aches.    Outpatient Medications Prior to Visit  Medication Sig Dispense Refill   baclofen  (LIORESAL ) 10 MG tablet Take 0.5 tablets (5 mg total) by mouth daily as needed for muscle spasms. 10 each 0   buPROPion (WELLBUTRIN XL) 150 MG 24 hr tablet Take 1 tablet (150 mg total) by mouth every morning. 30 tablet 2   buPROPion (WELLBUTRIN XL) 300 MG 24 hr tablet Take 1 tablet (300 mg total) by mouth daily. 30 tablet 2   busPIRone (BUSPAR) 30 MG tablet Take 1 tablet (30 mg total) by mouth 2 (two) times daily. 60 tablet 2   clonazePAM (KLONOPIN) 0.5 MG tablet TAKE 1 2 TO 1 (ONE HALF TO ONE) TABLET BY MOUTH TWICE DAILY AS NEEDED FOR ANXIETY AND PANIC ATTACK     dicyclomine  (BENTYL ) 20 MG tablet Take 1 tablet (20 mg total) by mouth 3 (three) times daily before meals. 90 tablet 1   Efinaconazole  10 % SOLN Apply 1 Application topically daily. 8 mL 0   fexofenadine  (ALLEGRA ) 180 MG tablet Take 1 tablet (180 mg total) by mouth daily as needed for allergies or rhinitis. 90 tablet 0   ketoconazole  (NIZORAL ) 2 % shampoo APPLY to affected areas (trunk) daily until no rash seen then use once weekly for prevention 120 mL  6   naproxen  (NAPROSYN ) 250 MG tablet Take 1 tablet (250 mg total) by mouth 2 (two) times daily as needed. Menstrual cramps. 60 tablet 0   spironolactone  (ALDACTONE ) 100 MG tablet Take 1 tablet (100 mg total) by mouth daily. 30 tablet 11   spironolactone  (ALDACTONE ) 25 MG tablet Take 1 tablet (25 mg total) by mouth daily. 30 tablet 11   Tavaborole  5 % SOLN Apply 1 Application topically daily. 10 mL 4   tretinoin  (RETIN-A ) 0.025 % cream Apply to affected areas every other night building up to nightly. 45 g 6   No facility-administered medications prior to visit.    Review of Systems;  Patient denies persistent headache, fevers, malaise, unintentional weight loss, skin rash, eye pain, sinus congestion and sinus pain, sore throat, dysphagia,  hemoptysis , cough, dyspnea, wheezing, chest pain, palpitations, orthopnea, edema, abdominal pain, nausea, melena, diarrhea, constipation, flank pain, dysuria, hematuria, urinary  Frequency, nocturia, numbness, tingling, seizures,  Focal weakness, Loss of consciousness,  Tremor, insomnia, depression, anxiety, and suicidal ideation.      Objective:  BP 94/68   Pulse 87   Ht 5' (1.524 m)   Wt 114 lb 6.4 oz (51.9 kg)   SpO2 98%   BMI 22.34 kg/m   BP Readings from Last 3 Encounters:  02/05/24 94/68  12/17/23 113/60  11/12/23 116/80    Wt Readings from Last 3 Encounters:  02/05/24 114 lb 6.4 oz (51.9 kg)  11/12/23 113 lb (51.3 kg)  11/07/23 114 lb (51.7 kg)    Physical Exam Vitals reviewed.  Constitutional:      General: She is not in acute distress.    Appearance: Normal appearance. She is normal weight. She is not ill-appearing, toxic-appearing or diaphoretic.  HENT:     Head: Normocephalic.     Right Ear: Tympanic membrane normal.     Left Ear: Tympanic membrane normal.     Mouth/Throat:     Tonsils: No tonsillar exudate or tonsillar abscesses.  Eyes:     General: No scleral icterus.       Right eye: No discharge.        Left eye:  No discharge.     Conjunctiva/sclera: Conjunctivae normal.  Neck:     Comments: Neck fullness without lymphadenopathy  Musculoskeletal:        General: Normal range of motion.     Cervical back: Normal range of motion and neck supple.  Lymphadenopathy:     Cervical: No cervical adenopathy.  Skin:    General: Skin is warm and dry.  Neurological:     General: No focal deficit present.     Mental Status: She is alert and oriented to person, place, and time. Mental status is at baseline.  Psychiatric:        Mood and Affect: Mood normal.        Behavior: Behavior normal.        Thought Content: Thought content normal.        Judgment: Judgment normal.     Lab Results  Component Value Date   HGBA1C 5.7 05/28/2023   HGBA1C 5.4 06/06/2016   HGBA1C 5.2 01/10/2016    Lab Results  Component Value Date   CREATININE 1.07 04/05/2023   CREATININE 0.97 09/11/2019   CREATININE 0.88 12/04/2018    Lab Results  Component Value Date   WBC 5.9 02/05/2024   HGB 13.9 02/05/2024   HCT 41.8 02/05/2024   PLT 231.0 02/05/2024   GLUCOSE 88 04/05/2023   CHOL 114 (L) 05/24/2015   TRIG 69 05/21/2014   HDL 50 05/24/2015   LDLCALC 52 05/21/2014   ALT 9 11/12/2023   AST 15 11/12/2023   NA 136 04/05/2023   K 4.6 04/05/2023   CL 102 04/05/2023   CREATININE 1.07 04/05/2023   BUN 11 04/05/2023   CO2 27 04/05/2023   TSH 1.93 02/05/2024   INR 1.1 09/11/2019   HGBA1C 5.7 05/28/2023    No results found.  Assessment & Plan:  .Sore throat -     POCT rapid strep A -     POCT Influenza A/B -     POC COVID-19 BinaxNow  Palpitations -     TSH -     CBC with Differential/Platelet  Sensation of lump in throat Assessment & Plan: Etiology not clear .  Exam not consistent with pharyngitis , flu or COVID.  May be early onset viral infection.. if not,  will obtain thyroid  ultrasound   Lab Results  Component Value Date   WBC 5.9 02/05/2024   HGB 13.9 02/05/2024   HCT 41.8 02/05/2024   MCV  91.6 02/05/2024   PLT 231.0 02/05/2024         I spent 34 minutes on the day of this face to face encounter reviewing patient's  most recent visit with cardiology,  nephrology,  and neurology,  prior relevant surgical and non surgical procedures, recent  labs and imaging studies, counseling on weight management,  reviewing the assessment and plan with patient, and post visit ordering and reviewing of  diagnostics and therapeutics with patient  .   Follow-up: No follow-ups on file.   Verneita LITTIE Kettering, MD

## 2024-02-05 NOTE — Telephone Encounter (Signed)
 FYI Only or Action Required?: FYI only for provider: appointment scheduled on today.  Patient was last seen in primary care on 11/12/2023 by Vincente Shivers, NP.  Called Nurse Triage reporting No chief complaint on file..  Symptoms began yesterday.  Interventions attempted: Nothing.  Symptoms are: unchanged.  Triage Disposition: See Physician Within 24 Hours  Patient/caregiver understands and will follow disposition?: Yes    Copied from CRM #[id number]. Topic: Clinical - Red Word Triage >> Feb 05, 2024  8:57 AM Macario HERO wrote: Red Word that prompted transfer to Nurse Triage: Patient said left side of face feels tight and hard to swallow, nose feels congested.. only on left side.  Reason for Disposition  [1] Symptoms of pill stuck in throat or esophagus (e.g., pain in throat or chest, FB sensation) AND [2] no relief after using Care Advice    Feels like something stuck in my throat  Answer Assessment - Initial Assessment Questions 1. DESCRIPTION: Tell me more about this problem. Are you  having trouble swallowing liquids, solids, or both? Any trouble with swallowing saliva (spit)?     Trouble swallowing on left side of throat 2. SEVERITY: How bad is the swallowing difficulty?  (Scale 1-10; or mild, moderate, severe)     Mild to moderate 3. ONSET: When did the swallowing problems begin?      Yesterday and happened one other time a week ago and week ago 4. CAUSE: What do you think is causing the problem?  (e.g., dry mouth, food or pill stuck in throat, mouth pain, sore throat, progression of disease process such as dementia or Parkinson's disease).      na 5. CHRONIC or RECURRENT: Is this a new problem for you?  If No, ask: How long have you had this problem? (e.g., days, weeks, months)      Na 6. OTHER SYMPTOMS: Do you have any other symptoms? (e.g., chest pain, difficulty breathing, mouth sores, sore throat, swollen tongue, chest pain)     Heart palpitations, numbness,  tinglings 7. PREGNANCY: Is there any chance you are pregnant? When was your last menstrual period?     Na  Nose stuffy on left side Face tight on left side Nose congestion on left  Pt had medication change about a month ago from buspar 30 mg to 60 mg.  Pt has history of anxiety and stated she also has been have increased anxiety s/s last couple of weeks like palpitations, nervousness, increased stress, mind racing.  Protocols used: Swallowing Difficulty-A-AH

## 2024-02-06 DIAGNOSIS — R09A2 Foreign body sensation, throat: Secondary | ICD-10-CM | POA: Insufficient documentation

## 2024-02-06 NOTE — Assessment & Plan Note (Signed)
 Etiology not clear .  Exam not consistent with pharyngitis , flu or COVID.  May be early onset viral infection.. if not,  will obtain thyroid  ultrasound   Lab Results  Component Value Date   WBC 5.9 02/05/2024   HGB 13.9 02/05/2024   HCT 41.8 02/05/2024   MCV 91.6 02/05/2024   PLT 231.0 02/05/2024

## 2024-02-07 ENCOUNTER — Ambulatory Visit: Payer: Self-pay | Admitting: Internal Medicine

## 2024-02-10 ENCOUNTER — Other Ambulatory Visit (HOSPITAL_COMMUNITY): Payer: Self-pay

## 2024-02-10 DIAGNOSIS — F3340 Major depressive disorder, recurrent, in remission, unspecified: Secondary | ICD-10-CM

## 2024-02-11 DIAGNOSIS — F411 Generalized anxiety disorder: Secondary | ICD-10-CM | POA: Diagnosis not present

## 2024-02-11 DIAGNOSIS — F331 Major depressive disorder, recurrent, moderate: Secondary | ICD-10-CM | POA: Diagnosis not present

## 2024-02-19 DIAGNOSIS — F331 Major depressive disorder, recurrent, moderate: Secondary | ICD-10-CM | POA: Diagnosis not present

## 2024-02-19 DIAGNOSIS — F411 Generalized anxiety disorder: Secondary | ICD-10-CM | POA: Diagnosis not present

## 2024-02-20 ENCOUNTER — Ambulatory Visit

## 2024-03-02 ENCOUNTER — Encounter (HOSPITAL_COMMUNITY): Payer: Self-pay | Admitting: Psychiatry

## 2024-03-02 ENCOUNTER — Telehealth (HOSPITAL_COMMUNITY): Admitting: Psychiatry

## 2024-03-02 VITALS — Wt 114.0 lb

## 2024-03-02 DIAGNOSIS — F332 Major depressive disorder, recurrent severe without psychotic features: Secondary | ICD-10-CM

## 2024-03-02 DIAGNOSIS — F411 Generalized anxiety disorder: Secondary | ICD-10-CM | POA: Diagnosis not present

## 2024-03-02 DIAGNOSIS — F331 Major depressive disorder, recurrent, moderate: Secondary | ICD-10-CM | POA: Diagnosis not present

## 2024-03-02 DIAGNOSIS — F4312 Post-traumatic stress disorder, chronic: Secondary | ICD-10-CM | POA: Diagnosis not present

## 2024-03-02 MED ORDER — LAMOTRIGINE 25 MG PO TABS: 25.0000 mg | ORAL_TABLET | Freq: Every day | ORAL | 1 refills | Status: DC

## 2024-03-02 MED ORDER — BUSPIRONE HCL 30 MG PO TABS: 30.0000 mg | ORAL_TABLET | Freq: Two times a day (BID) | ORAL | 1 refills | Status: DC

## 2024-03-02 MED ORDER — BUPROPION HCL ER (XL) 300 MG PO TB24: 300.0000 mg | ORAL_TABLET | Freq: Every day | ORAL | 0 refills | Status: DC
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
## 2024-03-11 DIAGNOSIS — F331 Major depressive disorder, recurrent, moderate: Secondary | ICD-10-CM | POA: Diagnosis not present

## 2024-03-11 DIAGNOSIS — F411 Generalized anxiety disorder: Secondary | ICD-10-CM | POA: Diagnosis not present

## 2024-03-21 DIAGNOSIS — F331 Major depressive disorder, recurrent, moderate: Secondary | ICD-10-CM | POA: Diagnosis not present

## 2024-03-21 DIAGNOSIS — F411 Generalized anxiety disorder: Secondary | ICD-10-CM | POA: Diagnosis not present

## 2024-03-27 ENCOUNTER — Encounter (HOSPITAL_COMMUNITY): Payer: Self-pay

## 2024-03-27 ENCOUNTER — Encounter: Payer: Self-pay | Admitting: Internal Medicine

## 2024-03-27 NOTE — Telephone Encounter (Signed)
 Pt aware provider out of office. Was informed to schedule an appt for re-eval

## 2024-03-29 DIAGNOSIS — F331 Major depressive disorder, recurrent, moderate: Secondary | ICD-10-CM | POA: Diagnosis not present

## 2024-03-29 DIAGNOSIS — F411 Generalized anxiety disorder: Secondary | ICD-10-CM | POA: Diagnosis not present

## 2024-04-06 ENCOUNTER — Telehealth (HOSPITAL_BASED_OUTPATIENT_CLINIC_OR_DEPARTMENT_OTHER): Admitting: Psychiatry

## 2024-04-06 ENCOUNTER — Encounter (HOSPITAL_COMMUNITY): Payer: Self-pay | Admitting: Psychiatry

## 2024-04-06 VITALS — Wt 114.0 lb

## 2024-04-06 DIAGNOSIS — F411 Generalized anxiety disorder: Secondary | ICD-10-CM

## 2024-04-06 DIAGNOSIS — F4312 Post-traumatic stress disorder, chronic: Secondary | ICD-10-CM | POA: Diagnosis not present

## 2024-04-06 DIAGNOSIS — F332 Major depressive disorder, recurrent severe without psychotic features: Secondary | ICD-10-CM

## 2024-04-06 MED ORDER — BUSPIRONE HCL 30 MG PO TABS: 30.0000 mg | ORAL_TABLET | Freq: Two times a day (BID) | ORAL | 1 refills | Status: DC

## 2024-04-06 MED ORDER — BUPROPION HCL ER (XL) 300 MG PO TB24: 300.0000 mg | ORAL_TABLET | Freq: Every day | ORAL | 0 refills | Status: AC

## 2024-04-06 NOTE — Progress Notes (Signed)
 " [facility] MD Virtual Progress Note   Patient Location: In Car Provider Location: Home Office  I connect with patient by video and verified that I am speaking with correct person by using two identifiers. I discussed the limitations of evaluation and management by telemedicine and the availability of in person appointments. I also discussed with the patient that there may be a patient responsible charge related to this service. The patient expressed understanding and agreed to proceed.  Bianca Forbes [id number] 29 y.o.  04/06/2024 11:04 AM  History of Present Illness:  Patient is 29 year old African-American employed single female who was seen first time 4 weeks ago.  Patient was referred from teaching services because she like to see the provider who she continue to see in the future and have more stability.  She struggle with depression since age 21.  Patient told Christmas was very quiet and she felt very sad because this is the time when she remember her difficult relationship with her mother.  She was alone.  We started her on Lamictal  and after few days she noticed a rash in her thigh and she decided to stop the medication.  Soon after stopping the Lamictal  she felt more sad and depressed and took at least a day or 2 extra Wellbutrin .  She is no longer taking extra Wellbutrin  and taking only 300 mg daily.  She is also on BuSpar  30 mg twice a day.  She reported sleep is fair.  She still struggle with chronic dysphoria, struggle with motivation, energy, anxiety.  She think about her past specially break-up last year and has no contact with the mother more than 2 years ago.  She reported her job is going okay.  She is close to her grandma and lately spent time with her.  She is working at set [employer] and like to [employer] in psychology.  She admitted struggle with her past specially childhood trauma.  We have recommended EMDR but patient has not decided yet but like to  explore this option.  She denies any hallucination, paranoia or active suicidal thoughts.  She denies any mania aggression balance or drug use.  Past Psychiatric History: H/O depression since age 5.  Overdose 10 tablets of ibuprofen  at age 89.  Saw Dr. Akintayo.  Took Prozac, Zoloft, trazodone.  Cymbalta  because dry mouth and Lamictal  caused rash.  Had TMS but no significant improvement.  Last seen by teaching service.  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   Tinnitus    Saw ENT, assoc with emesis when occured    Outpatient Encounter Medications as of 04/06/2024  Medication Sig   baclofen  (LIORESAL ) 10 MG tablet Take 0.5 tablets (5 mg total) by mouth daily as needed for muscle spasms.   buPROPion  (WELLBUTRIN  XL) 150 MG 24 hr tablet Take 1 tablet (150 mg total) by mouth every morning. (Patient not taking: Reported on 03/02/2024)   buPROPion  (WELLBUTRIN  XL) 300 MG 24 hr tablet Take 1 tablet (300 mg total) by mouth daily.   busPIRone  (BUSPAR ) 30 MG tablet Take 1 tablet (30 mg total) by mouth 2 (two) times daily.   clonazePAM (KLONOPIN) 0.5 MG tablet TAKE 1 2 TO 1 (ONE HALF TO ONE) TABLET BY  MOUTH TWICE DAILY AS NEEDED FOR ANXIETY AND PANIC ATTACK   dicyclomine  (BENTYL ) 20 MG tablet Take 1 tablet (20 mg total) by mouth 3 (three) times daily before meals.   Efinaconazole  10 % SOLN Apply 1 Application topically daily.   fexofenadine  (ALLEGRA ) 180 MG tablet Take 1 tablet (180 mg total) by mouth daily as needed for allergies or rhinitis.   ketoconazole  (NIZORAL ) 2 % shampoo APPLY to affected areas (trunk) daily until no rash seen then use once weekly for prevention   lamoTRIgine  (LAMICTAL ) 25 MG tablet Take 1 tablet (25 mg  total) by mouth daily.   naproxen  (NAPROSYN ) 250 MG tablet Take 1 tablet (250 mg total) by mouth 2 (two) times daily as needed. Menstrual cramps.   spironolactone  (ALDACTONE ) 100 MG tablet Take 1 tablet (100 mg total) by mouth daily.   spironolactone  (ALDACTONE ) 25 MG tablet Take 1 tablet (25 mg total) by mouth daily.   Tavaborole  5 % SOLN Apply 1 Application topically daily.   tretinoin  (RETIN-A ) 0.025 % cream Apply to affected areas every other night building up to nightly.   No facility-administered encounter medications on file as of 04/06/2024.    Recent Results (from the past 2160 hours)  POCT rapid strep A     Status: None   Collection Time: 02/05/24  1:20 PM  Result Value Ref Range   Rapid Strep A Screen Negative Negative  POCT Influenza A/B     Status: None   Collection Time: 02/05/24  1:20 PM  Result Value Ref Range   Influenza A, POC Negative Negative   Influenza B, POC Negative Negative  POC COVID-19     Status: None   Collection Time: 02/05/24  1:20 PM  Result Value Ref Range   SARS Coronavirus 2 Ag Negative Negative  TSH     Status: None   Collection Time: 02/05/24  1:38 PM  Result Value Ref Range   TSH 1.93 0.35 - 5.50 uIU/mL  CBC with Differential/Platelet     Status: None   Collection Time: 02/05/24  1:38 PM  Result Value Ref Range   WBC 5.9 4.0 - 10.5 K/uL   RBC 4.56 3.87 - 5.11 Mil/uL   Hemoglobin 13.9 12.0 - 15.0 g/dL   HCT 58.1 63.9 - 53.9 %   MCV 91.6 78.0 - 100.0 fl   MCHC 33.2 30.0 - 36.0 g/dL   RDW 86.1 88.4 - 84.4 %   Platelets 231.0 150.0 - 400.0 K/uL   Neutrophils Relative % 51.7 43.0 - 77.0 %   Lymphocytes Relative 38.7 12.0 - 46.0 %   Monocytes Relative 7.3 3.0 - 12.0 %   Eosinophils Relative 1.7 0.0 - 5.0 %   Basophils Relative 0.6 0.0 - 3.0 %   Neutro Abs 3.0 1.4 - 7.7 K/uL   Lymphs Abs 2.3 0.7 - 4.0 K/uL   Monocytes Absolute 0.4 0.1 - 1.0 K/uL   Eosinophils Absolute 0.1 0.0 - 0.7 K/uL   Basophils Absolute 0.0 0.0 - 0.1 K/uL      Psychiatric Specialty Exam: Physical Exam  Review of Systems  Weight 114 lb (51.7 kg).There is no height or weight on file to calculate BMI.  General Appearance: Casual  Eye Contact:  Good  Speech:  Slow  Volume:  Decreased  Mood:  Depressed, Dysphoric, and Hopeless  Affect:  Depressed  Thought Process:  Goal Directed  Orientation:  Full (Time, Place, and Person)  Thought Content:  Rumination  Suicidal Thoughts:  No  Homicidal Thoughts:  No  Memory:  Immediate;   Good Recent;   Good Remote;   Good  Judgement:  Intact  Insight:  Present  Psychomotor Activity:  Decreased  Concentration:  Concentration: Good and Attention Span: Good  Recall:  Good  Fund of Knowledge:  Good  Language:  Good  Akathisia:  No  Handed:  Right  AIMS (if indicated):     Assets:  Communication Skills Desire for Improvement Housing Talents/Skills Transportation  ADL's:  Intact  Cognition:  WNL  Sleep:  fair       03/02/2024   12:35 PM 01/13/2024    2:05 PM 11/12/2023   10:37 AM 11/07/2023   12:52 PM 10/24/2023   10:19 AM  Depression screen PHQ 2/9  Decreased Interest 2 3 1 2 2   Down, Depressed, Hopeless 1 2 1 3 2   PHQ - 2 Score 3 5 2 5 4   Altered sleeping 1 2 3 3 3   Tired, decreased energy 2 3 2 2 2   Change in appetite 0 2 3 3 3   Feeling bad or failure about yourself  1 0 1 1 0  Trouble concentrating 1 1 3 2 1   Moving slowly or fidgety/restless 1 0 1 0 1  Suicidal thoughts 1 1 0 0 0  PHQ-9 Score 10 14  15  16  14    Difficult doing work/chores  Very difficult Very difficult Very difficult Not difficult at all     Data saved with a previous flowsheet row definition    Assessment/Plan: Major depressive disorder, recurrent, severe without psychotic features (HCC) - Plan: buPROPion  (WELLBUTRIN  XL) 300 MG 24 hr tablet  GAD (generalized anxiety disorder) - Plan: busPIRone  (BUSPAR ) 30 MG tablet, buPROPion  (WELLBUTRIN  XL) 300 MG 24 hr tablet  Chronic post-traumatic stress disorder  (PTSD) - Plan: buPROPion  (WELLBUTRIN  XL) 300 MG 24 hr tablet  Patient is 29 year old single employed female with history of major depressive disorder, panic attacks, chronic PTSD and generalized anxiety disorder.  Discontinue Lamictal  as patient had a rash after taking for few days on her thigh which resolved within 24 hours.  She is taking BuSpar  and Wellbutrin  XL 300 mg.  She took twice extra 150 Wellbutrin .  Discussed to consider trauma therapy/EMDR.  We will refer her for EMDR.  Also discussed to consider GeneSight testing as patient had tried few other medication with poor response.  Patient considering TMS however in the past he did not help her.  Recommend to wait until we do GeneSight testing.  She agreed with the plan.  She is taking Klonopin prescribed by Dr. Akintayo and still has few refills.  Recommend to call back if she has any question or any concern.  Follow-up in 4 to 6 weeks.   Follow Up Instructions:     I discussed the assessment and treatment plan with the patient. The patient was provided an opportunity to ask questions and all were answered. The patient agreed with the plan and demonstrated an understanding of the instructions.   The patient was advised to call back or seek an in-person evaluation if the symptoms worsen or if the condition fails to improve as anticipated.    Collaboration of Care: Other provider involved in patient's care AEB notes are available in epic to review  Patient/Guardian was advised Release of Information must be obtained prior to any record release in order to collaborate their care with an outside provider. Patient/Guardian was advised if they have not already done so to contact the registration  department to sign all necessary forms in order for us  to release information regarding their care.   Consent: Patient/Guardian gives verbal consent for treatment and assignment of benefits for services provided during this visit. Patient/Guardian  expressed understanding and agreed to proceed.     Total encounter time 28 minutes which includes face-to-face time, chart reviewed, care coordination, order entry and documentation during this encounter.   Note: This document was prepared by [employer] voice dictation technology and any errors that results from this process are unintentional.    Leni ONEIDA Client, MD 04/06/2024   "

## 2024-04-09 ENCOUNTER — Ambulatory Visit (HOSPITAL_COMMUNITY)

## 2024-04-09 DIAGNOSIS — F411 Generalized anxiety disorder: Secondary | ICD-10-CM

## 2024-04-09 DIAGNOSIS — F4312 Post-traumatic stress disorder, chronic: Secondary | ICD-10-CM

## 2024-04-09 DIAGNOSIS — F332 Major depressive disorder, recurrent severe without psychotic features: Secondary | ICD-10-CM

## 2024-04-09 NOTE — Progress Notes (Signed)
 Patient came in today for a Genesight test - Patient had no questions and tolerated well

## 2024-04-13 ENCOUNTER — Ambulatory Visit: Payer: Self-pay | Admitting: General Practice

## 2024-04-13 DIAGNOSIS — Z91199 Patient's noncompliance with other medical treatment and regimen due to unspecified reason: Secondary | ICD-10-CM

## 2024-04-13 NOTE — Progress Notes (Signed)
 Patient no-showed today's appointment.

## 2024-04-14 ENCOUNTER — Ambulatory Visit: Payer: Self-pay

## 2024-04-14 NOTE — Telephone Encounter (Signed)
 Will see patient then Agree with ER and UC precautions

## 2024-04-14 NOTE — Telephone Encounter (Signed)
 FYI Only or Action Required?: FYI only for provider: appointment scheduled on 04/16/2024.  Patient was last seen in primary care on 02/05/2024 by Marylynn Verneita CROME, MD.  Called Nurse Triage reporting [place].  Symptoms began several months ago.   Triage Disposition: See PCP When Office is Open (Within 3 Days)  Patient/caregiver understands and will follow disposition?: Yes               Copied from CRM #[id number]. Topic: Clinical - Red Word Triage >> Apr 14, 2024  8:08 AM Bianca Forbes wrote: Bianca Forbes that prompted transfer to Nurse Triage:  lump in the back of throat on left side and also congestion left side ongoing for 2 months states it flares up Reason for Disposition  [1] Small swelling or lump AND [2] unexplained AND [3] present > 1 week  Answer Assessment - Initial Assessment Questions This RN scheduled pt an appointment for this Thurs, 1/15, with Dr. Randeen as pt PCP does not have availability until 1/22 and pt states she wants to be seen sooner. This RN educated pt on new-worsening symptoms and  when to call back/seek emergent care. Pt verbalized understanding and agrees to plan.   Lump on left side of throat intermittent x2 months (3-5x a week pt notices it) Pt was seen in office for this on 11/5 by Dr. Marylynn; was told the area felt full but not enlarged Pt states all her thyroid  levels came back normal at the time Notices it more when laying down Phlegm on left side, not as bad at this moment (present when lump is present, clear in color)  Denies pain, difficulty breathing, sore throat, swollen tongue, pain, fever, hoarseness/voice changes, ear pain  One time episode in Dec when pt had a sharp pain from neck to head, shooting pain that went away  Protocols used: Skin Lump or Localized Swelling-A-AH

## 2024-04-14 NOTE — Telephone Encounter (Signed)
 I appreciate Dr. Graham evaluation.   Patient No show for her appointment yesterday (04/13/24)

## 2024-04-16 ENCOUNTER — Encounter: Payer: Self-pay | Admitting: Family Medicine

## 2024-04-16 ENCOUNTER — Ambulatory Visit: Admitting: Family Medicine

## 2024-04-16 VITALS — BP 108/72 | HR 96 | Temp 98.1°F | Ht 60.0 in | Wt 111.4 lb

## 2024-04-16 DIAGNOSIS — R0981 Nasal congestion: Secondary | ICD-10-CM | POA: Insufficient documentation

## 2024-04-16 DIAGNOSIS — R09A2 Foreign body sensation, throat: Secondary | ICD-10-CM | POA: Diagnosis not present

## 2024-04-16 MED ORDER — FLUTICASONE PROPIONATE 50 MCG/ACT NA SUSP: 2.0000 | Freq: Every day | NASAL | 1 refills | Status: AC

## 2024-04-16 NOTE — Patient Instructions (Addendum)
 I put the referral in for thyroid  ultrasound  Please let us  know if you don't hear in 1-2 weeks to set that up (mychart message or call or letter)   For congestion  Start generic flonase  nasal spray  2 sprays in each nostril twice daily for a week, then go down to once daily    If symptoms suddenly worsen or change please call

## 2024-04-16 NOTE — Assessment & Plan Note (Signed)
 Pt feels this more on left side  No ST or throat clearing  Reviewed notes from Dr Roland planned US  thyroid  but was not ordered Normal cbc and tsh at that time  Reassuring exam  Thyroid  is prominent but not asymmetric on exam   Will start with thyroid  us 

## 2024-04-16 NOTE — Assessment & Plan Note (Signed)
 Per pt -left side more than right No ST Does feel lump in throat and unsure if related Reassuring exam   Encouraged use of flonase  on a schedule instead of prn to give it time to work  Prescription this 2 sp  /each nostril daily   Update if not starting to improve in a week or if worsening  Call back and Er precautions noted in detail today

## 2024-04-16 NOTE — Progress Notes (Signed)
 "  Subjective:    Patient ID: Bianca Forbes, female    DOB: 1996-01-21, 29 y.o.   MRN: [id number]  HPI  Wt Readings from Last 3 Encounters:  04/16/24 111 lb 6 oz (50.5 kg)  02/05/24 114 lb 6.4 oz (51.9 kg)  11/12/23 113 lb (51.3 kg)   21.75 kg/m  Vitals:   04/16/24 1031  BP: 108/72  Pulse: 96  Temp: 98.1 F (36.7 C)  SpO2: 100%    29 yo pf of NP Vincente presents with c/o  Feels like a lump in throat  Also left sided nasal congestion     Per triage note  Lump on left side of throat intermittent x2 months (3-5x a week pt notices it) Pt was seen in office for this on 11/5 by Dr. Marylynn; was told the area felt full but not enlarged Pt states all her thyroid  levels came back normal at the time Notices it more when laying down Phlegm on left side, not as bad at this moment (present when lump is present, clear in color)   Began with drainage/took benadryl  The ST did improve   Congestion left nostril  No sinus pain or pressure Clear mucous No fever   Feels like a lump on left side in throat  Notes more when she lay down  Does not hurt  Some feeling of drainage  Under jaw and chin   No difficulty swallowing  No pain with swallowing  No choking on food or water  Ear feels no different from usual   No history of allergies  Does have history of hives/itching- used to take allegra     Was seen by Dr Marylynn for sensation of lump in throat in early November Also sore throat  Had neg viral testing  Normal cbc Thyroid  us  was mentioned /ordered   Lab Results  Component Value Date   WBC 5.9 02/05/2024   HGB 13.9 02/05/2024   HCT 41.8 02/05/2024   MCV 91.6 02/05/2024   PLT 231.0 02/05/2024   Lab Results  Component Value Date   TSH 1.93 02/05/2024     Patient Active Problem List   Diagnosis Date Noted   Nasal congestion 04/16/2024   Sensation of lump in throat 02/06/2024   Onychomycosis 04/05/2023   Chronic idiopathic urticaria 04/05/2023    Dysmenorrhea 04/05/2023   Acne 04/05/2023   Encounter to establish care with new doctor 04/05/2023   Major depressive disorder, recurrent, severe without psychotic features (HCC) 04/05/2023   Anxiety and depression 05/20/2019   Prediabetes 05/24/2015   Nexplanon in place 04/08/2014   Vitamin D  insufficiency 02/01/2013   Irritable bowel syndrome 01/06/2013   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   Tinnitus    Saw ENT, assoc with emesis when occured   Past Surgical History:  Procedure Laterality Date   hemmorroid surgery  2024   Social History[1] Family History  Problem Relation Age of Onset   Diabetes Mother        Type 2   Irritable bowel syndrome Father    Lactose intolerance Father    Vitiligo Father    Alzheimer's disease Maternal Grandmother        Died at 56   Seizures Maternal  Grandmother    Kidney disease Maternal Grandfather        Died at 35   Other Paternal Grandfather        Died at 106 from natural causes   Seizures Cousin        Maternal 1st Cousin   Allergies[2] Medications Ordered Prior to Encounter[3]  Review of Systems  Constitutional:  Negative for activity change, appetite change, fatigue, fever and unexpected weight change.  HENT:  Positive for congestion. Negative for ear pain, rhinorrhea, sinus pressure, sinus pain, sore throat, trouble swallowing and voice change.   Eyes:  Negative for pain, redness and visual disturbance.  Respiratory:  Negative for cough, shortness of breath and wheezing.   Cardiovascular:  Negative for chest pain and palpitations.  Gastrointestinal:  Negative for abdominal pain, blood in stool, constipation and diarrhea.  Endocrine: Negative for  polydipsia and polyuria.  Genitourinary:  Negative for dysuria, frequency and urgency.  Musculoskeletal:  Negative for arthralgias, back pain and myalgias.  Skin:  Negative for pallor and rash.  Allergic/Immunologic: Negative for environmental allergies.  Neurological:  Negative for dizziness, syncope and headaches.  Hematological:  Negative for adenopathy. Does not bruise/bleed easily.  Psychiatric/Behavioral:  Negative for decreased concentration and dysphoric mood. The patient is not nervous/anxious.        Objective:   Physical Exam Constitutional:      General: She is not in acute distress.    Appearance: Normal appearance. She is well-developed and normal weight. She is not ill-appearing or diaphoretic.  HENT:     Head: Normocephalic and atraumatic.     Right Ear: Tympanic membrane, ear canal and external ear normal.     Left Ear: Tympanic membrane, ear canal and external ear normal.     Nose: Congestion present.     Comments: Mild nasal congestion  Left worse than right Boggy nares    Mouth/Throat:     Mouth: Mucous membranes are moist.     Pharynx: Oropharynx is clear. No oropharyngeal exudate or posterior oropharyngeal erythema.     Comments: No lesion/mass or swelling Eyes:     General: No scleral icterus.       Right eye: No discharge.        Left eye: No discharge.     Conjunctiva/sclera: Conjunctivae normal.     Pupils: Pupils are equal, round, and reactive to light.  Neck:     Thyroid : No thyromegaly.     Vascular: No carotid bruit or JVD.     Comments: Thyroid  feels prominent but not asymmetric  Mildly tender on left  Cardiovascular:     Rate and Rhythm: Regular rhythm. Tachycardia present.     Heart sounds: Normal heart sounds.     No gallop.  Pulmonary:     Effort: Pulmonary effort is normal. No respiratory distress.     Breath sounds: Normal breath sounds. No stridor. No wheezing, rhonchi or rales.  Chest:     Chest wall: No tenderness.  Abdominal:      General: There is no distension or abdominal bruit.     Palpations: Abdomen is soft.  Musculoskeletal:     Cervical back: Normal range of motion and neck supple. Tenderness present.     Right lower leg: No edema.     Left lower leg: No edema.  Lymphadenopathy:     Cervical: No cervical adenopathy.  Skin:    General: Skin is warm and dry.     Coloration: Skin is not pale.  Findings: No rash.  Neurological:     Mental Status: She is alert.     Coordination: Coordination normal.     Deep Tendon Reflexes: Reflexes are normal and symmetric. Reflexes normal.  Psychiatric:        Mood and Affect: Mood normal.           Assessment & Plan:   Problem List Items Addressed This Visit       Other   Sensation of lump in throat - Primary   Pt feels this more on left side  No ST or throat clearing  Reviewed notes from Dr Roland planned US  thyroid  but was not ordered Normal cbc and tsh at that time  Reassuring exam  Thyroid  is prominent but not asymmetric on exam   Will start with thyroid  us        Relevant Orders   US  THYROID    Nasal congestion   Per pt -left side more than right No ST Does feel lump in throat and unsure if related Reassuring exam   Encouraged use of flonase  on a schedule instead of prn to give it time to work  Prescription this 2 sp  /each nostril daily   Update if not starting to improve in a week or if worsening  Call back and Er precautions noted in detail today           [1]  Social History Tobacco Use   Smoking status: Never    Passive exposure: Never   Smokeless tobacco: Never  Vaping Use   Vaping status: Never Used  Substance Use Topics   Alcohol use: No    Alcohol/week: 0.0 standard drinks of alcohol   Drug use: No  [2] No Known Allergies [3]  Current Outpatient Medications on File Prior to Visit  Medication Sig Dispense Refill   baclofen  (LIORESAL ) 10 MG tablet Take 0.5 tablets (5 mg total) by mouth daily as needed for  muscle spasms. 10 each 0   buPROPion  (WELLBUTRIN  XL) 150 MG 24 hr tablet Take 1 tablet (150 mg total) by mouth every morning. 30 tablet 2   buPROPion  (WELLBUTRIN  XL) 300 MG 24 hr tablet Take 1 tablet (300 mg total) by mouth daily. 90 tablet 0   busPIRone  (BUSPAR ) 30 MG tablet Take 1 tablet (30 mg total) by mouth 2 (two) times daily. 60 tablet 1   clonazePAM (KLONOPIN) 0.5 MG tablet TAKE 1 2 TO 1 (ONE HALF TO ONE) TABLET BY MOUTH TWICE DAILY AS NEEDED FOR ANXIETY AND PANIC ATTACK     dicyclomine  (BENTYL ) 20 MG tablet Take 1 tablet (20 mg total) by mouth 3 (three) times daily before meals. 90 tablet 1   Efinaconazole  10 % SOLN Apply 1 Application topically daily. 8 mL 0   fexofenadine  (ALLEGRA ) 180 MG tablet Take 1 tablet (180 mg total) by mouth daily as needed for allergies or rhinitis. 90 tablet 0   ketoconazole  (NIZORAL ) 2 % shampoo APPLY to affected areas (trunk) daily until no rash seen then use once weekly for prevention 120 mL 6   naproxen  (NAPROSYN ) 250 MG tablet Take 1 tablet (250 mg total) by mouth 2 (two) times daily as needed. Menstrual cramps. 60 tablet 0   spironolactone  (ALDACTONE ) 100 MG tablet Take 1 tablet (100 mg total) by mouth daily. 30 tablet 11   spironolactone  (ALDACTONE ) 25 MG tablet Take 1 tablet (25 mg total) by mouth daily. 30 tablet 11   Tavaborole  5 % SOLN Apply 1 Application topically daily. (Patient  not taking: Reported on 04/16/2024) 10 mL 4   tretinoin  (RETIN-A ) 0.025 % cream Apply to affected areas every other night building up to nightly. (Patient not taking: Reported on 04/16/2024) 45 g 6   No current facility-administered medications on file prior to visit.   "

## 2024-04-28 ENCOUNTER — Ambulatory Visit: Payer: Self-pay | Admitting: Family Medicine

## 2024-04-28 ENCOUNTER — Ambulatory Visit
Admission: RE | Admit: 2024-04-28 | Discharge: 2024-04-28 | Disposition: A | Source: Ambulatory Visit | Attending: Family Medicine | Admitting: Family Medicine

## 2024-04-28 ENCOUNTER — Other Ambulatory Visit (HOSPITAL_COMMUNITY): Payer: Self-pay | Admitting: Psychiatry

## 2024-04-28 DIAGNOSIS — F411 Generalized anxiety disorder: Secondary | ICD-10-CM

## 2024-04-28 DIAGNOSIS — R09A2 Foreign body sensation, throat: Secondary | ICD-10-CM

## 2024-04-29 ENCOUNTER — Ambulatory Visit: Payer: Self-pay

## 2024-04-29 NOTE — Telephone Encounter (Signed)
 Copied from CRM #[id number]. Topic: Clinical - Lab/Test Results >> Apr 29, 2024  1:45 PM Alfonso HERO wrote: Reason for CRM: patient called for results. I did relay them to her and she understood.

## 2024-04-29 NOTE — Telephone Encounter (Signed)
 Attempted to call pt concerning appointment tomorrow scheduled with Dr. Watt.  Would like to reschedule with Dr. Randeen who has openings tomorrow and order the US .  Pt's PCP does not have an opening until 05/12/24.

## 2024-04-29 NOTE — Telephone Encounter (Signed)
 FYI Only or Action Required?: FYI only for provider: appointment scheduled on 04/30/24.  Patient was last seen in primary care on 04/16/2024 by Randeen Laine LABOR, MD.  Called Nurse Triage reporting Appointment.  Symptoms began several months ago.  Interventions attempted: Nothing.  Symptoms are: unchanged.  Triage Disposition: Information or Advice Only Call  Patient/caregiver understands and will follow disposition?: Yes  Reason for Disposition  Health information question, no triage required and triager able to answer question  Answer Assessment - Initial Assessment Questions Pt calling in to figure out next steps in care. Pt reports feeling a lump in the back left side of her throat for 3 months, had an US  thyroid  - came back normal. Patient states she notices the lump in her throat when there is a flare up. The flare ups happen randomly and pt experiences a clogged nose on the left side. Pt states she has been trying to get it figured out - saw 3 HCPs so far. Writer reviewed US  thyroid  results and recommended to schedule the follow-up visit with PCP for further evaluation.   1. REASON FOR CALL: What is the main reason for your call? or How can I best help you?     Pt looking for next steps in care  2. SYMPTOMS : Do you have any symptoms?      Denies new symptoms  3. OTHER QUESTIONS: Do you have any other questions?     Denies  Protocols used: Information Only Call - No Triage-A-AH  Reason for Triage: pt has lump in throat and would like to se PCP - at this point wondering if should get new PCP who is more avialble.  Wants to see provider. Is concerned and would like to know her options.

## 2024-04-29 NOTE — Telephone Encounter (Signed)
 Noted and agree. Patient does need further evaluation and possibly ENT referral.

## 2024-04-30 ENCOUNTER — Encounter: Payer: Self-pay | Admitting: Family Medicine

## 2024-04-30 ENCOUNTER — Ambulatory Visit: Admitting: Family Medicine

## 2024-04-30 VITALS — BP 116/68 | HR 86 | Temp 98.2°F | Ht 60.0 in | Wt 115.2 lb

## 2024-04-30 DIAGNOSIS — R0982 Postnasal drip: Secondary | ICD-10-CM | POA: Diagnosis not present

## 2024-04-30 DIAGNOSIS — J392 Other diseases of pharynx: Secondary | ICD-10-CM | POA: Diagnosis not present

## 2024-04-30 NOTE — Progress Notes (Signed)
 "    Bianca Erney T. Bianca Rowen, MD, CAQ Sports Medicine [facility] [address] [place], [address]  Phone: [phone]  FAX: [phone]  Bianca Forbes - 29 y.o. female  MRN [id number]  Date of Birth: February 27, 1996  Date: 04/30/2024  PCP: Vincente Shivers, NP  Referral: Vincente Shivers, NP  Chief Complaint  Patient presents with   Results    Here for thyroid  US  f/u to discuss next steps.    Subjective:   Bianca Forbes is a 29 y.o. very pleasant female patient with Body mass index is 22.51 kg/m. who presents with the following:  Discussed the use of AI scribe software for clinical note transcription with the patient, who gave verbal consent to proceed.   History of Present Illness Bianca Forbes is a 29 year old female who presents with a sensation of a lump in the throat and left-sided nasal congestion.  She has experienced a sensation of a lump in the back of her throat since December, described as similar to the sensation before crying. This sensation is localized to the left side and is accompanied by left-sided nasal congestion. She initially sought medical attention within a week of symptom onset, where she was tested for COVID-19 and flu, both of which were negative. Thyroid  US  normal.  The throat sensation fluctuates between feeling dry and raw to phlegmy. She has tried Benadryl, which helped with drainage, and DayQuil. Recently, she started using Flonase  nasal spray and Allegra , which she takes for hives, but has only been using them for a couple of days.  She experiences occasional shooting pain in the back of her neck that radiates upwards, occurring only on the left side. This pain is infrequent and she is unsure if it is related to her other symptoms.  She reports no difficulty swallowing food or liquids and states she cannot physically feel any lump externally. She has a history of ear infections and tinnitus, and sometimes  experiences fluttering in her ear. She does not smoke, vape, or use tobacco products.  The nasal congestion and throat irritation often occur simultaneously, with congestion being intermittent and sometimes severe enough to wake her at night. No allergies, runny nose, nasal drip, itchy eyes, or symptoms typical of a cold or flu.    Review of Systems is noted in the HPI, as appropriate  Objective:   BP 116/68   Pulse 86   Temp 98.2 F (36.8 C) (Oral)   Ht 5' (1.524 m)   Wt 115 lb 4 oz (52.3 kg)   LMP 04/02/2024   SpO2 98%   BMI 22.51 kg/m   GEN: No acute distress; alert,appropriate. PULM: Breathing comfortably in no respiratory distress Neck with no LAD, no mass or pain at the parotid Throat clear  CERVICAL SPINE EXAM Range of motion: Flexion, extension, lateral bending, and rotation: full Spurling's: Negative Pain with terminal motion: no Spinous Processes: NT SCM: NT Upper paracervical muscles: minimal Upper traps: NT  PSYCH: Normally interactive.   Laboratory and Imaging Data:  Assessment and Plan:     ICD-10-CM   1. Mass of throat  J39.2 Ambulatory referral to ENT    2. Post-nasal drip  R09.82 Ambulatory referral to ENT     Assessment & Plan Postnasal drip with associated throat discomfort Chronic postnasal drip with throat discomfort, likely due to allergies. Low suspicion for malignancy. - Start Allegra  once daily. - Use Flonase  nasal spray, two sprays in each nostril once  daily. - With her concerns regarding a possible upper throat mass, Referred to ENT for further evaluation.  Eustachian tube dysfunction with middle ear effusion Fluid behind the R>L ear with no current infection, causing muffled sensation and pressure. - Continue to monitor symptoms and manage conservatively.  Medication Management during today's office visit: No orders of the defined types were placed in this encounter.  Medications Discontinued During This Encounter  Medication  Reason   ketoconazole  (NIZORAL ) 2 % shampoo Completed Course   Efinaconazole  10 % SOLN Completed Course   tretinoin  (RETIN-A ) 0.025 % cream Completed Course   Tavaborole  5 % SOLN Completed Course    Orders placed today for conditions managed today: Orders Placed This Encounter  Procedures   Ambulatory referral to ENT    Disposition: No follow-ups on file.  [employer] speech-to-text software was used for transcription in this dictation.  Possible transcriptional errors can occur using Animal nutritionist.   Signed,  Jacques DASEN. Anjolina Byrer, MD   Outpatient Encounter Medications as of 04/30/2024  Medication Sig   baclofen  (LIORESAL ) 10 MG tablet Take 0.5 tablets (5 mg total) by mouth daily as needed for muscle spasms.   buPROPion  (WELLBUTRIN  XL) 150 MG 24 hr tablet Take 1 tablet (150 mg total) by mouth every morning.   buPROPion  (WELLBUTRIN  XL) 300 MG 24 hr tablet Take 1 tablet (300 mg total) by mouth daily.   busPIRone  (BUSPAR ) 30 MG tablet Take 1 tablet (30 mg total) by mouth 2 (two) times daily.   clonazePAM (KLONOPIN) 0.5 MG tablet TAKE 1 2 TO 1 (ONE HALF TO ONE) TABLET BY MOUTH TWICE DAILY AS NEEDED FOR ANXIETY AND PANIC ATTACK   dicyclomine  (BENTYL ) 20 MG tablet Take 1 tablet (20 mg total) by mouth 3 (three) times daily before meals.   fexofenadine  (ALLEGRA ) 180 MG tablet Take 1 tablet (180 mg total) by mouth daily as needed for allergies or rhinitis.   fluticasone  (FLONASE ) 50 MCG/ACT nasal spray Place 2 sprays into both nostrils daily.   naproxen  (NAPROSYN ) 250 MG tablet Take 1 tablet (250 mg total) by mouth 2 (two) times daily as needed. Menstrual cramps.   spironolactone  (ALDACTONE ) 100 MG tablet Take 1 tablet (100 mg total) by mouth daily.   spironolactone  (ALDACTONE ) 25 MG tablet Take 1 tablet (25 mg total) by mouth daily.   [DISCONTINUED] Efinaconazole  10 % SOLN Apply 1 Application topically daily.   [DISCONTINUED] ketoconazole  (NIZORAL ) 2 % shampoo APPLY to affected  areas (trunk) daily until no rash seen then use once weekly for prevention   [DISCONTINUED] Tavaborole  5 % SOLN Apply 1 Application topically daily. (Patient not taking: Reported on 04/16/2024)   [DISCONTINUED] tretinoin  (RETIN-A ) 0.025 % cream Apply to affected areas every other night building up to nightly. (Patient not taking: Reported on 04/16/2024)   No facility-administered encounter medications on file as of 04/30/2024.   "

## 2024-05-04 ENCOUNTER — Encounter (HOSPITAL_COMMUNITY): Payer: Self-pay | Admitting: Psychiatry

## 2024-05-04 ENCOUNTER — Telehealth (HOSPITAL_COMMUNITY): Admitting: Psychiatry

## 2024-05-04 VITALS — Wt 112.0 lb

## 2024-05-04 DIAGNOSIS — F332 Major depressive disorder, recurrent severe without psychotic features: Secondary | ICD-10-CM | POA: Diagnosis not present

## 2024-05-04 DIAGNOSIS — F4312 Post-traumatic stress disorder, chronic: Secondary | ICD-10-CM | POA: Diagnosis not present

## 2024-05-04 DIAGNOSIS — F411 Generalized anxiety disorder: Secondary | ICD-10-CM

## 2024-05-04 MED ORDER — VILAZODONE HCL 10 MG PO TABS: 10.0000 mg | ORAL_TABLET | Freq: Every day | ORAL | Status: AC

## 2024-05-04 MED ORDER — BUSPIRONE HCL 30 MG PO TABS: 30.0000 mg | ORAL_TABLET | Freq: Two times a day (BID) | ORAL | 1 refills | Status: AC

## 2024-05-14 ENCOUNTER — Encounter: Admitting: General Practice

## 2024-06-01 ENCOUNTER — Telehealth (HOSPITAL_COMMUNITY): Admitting: Psychiatry

## 2024-06-11 ENCOUNTER — Institutional Professional Consult (permissible substitution) (INDEPENDENT_AMBULATORY_CARE_PROVIDER_SITE_OTHER)

## 2024-06-15 ENCOUNTER — Ambulatory Visit: Admitting: Physician Assistant
